# Patient Record
Sex: Male | Born: 1965 | Race: White | Hispanic: No | Smoking: Current every day smoker
Health system: Southern US, Community
[De-identification: ages and names within clinical notes are randomized; demographics above are authoritative.]

## PROBLEM LIST (undated history)

## (undated) DIAGNOSIS — I10 Essential (primary) hypertension: Secondary | ICD-10-CM

## (undated) DIAGNOSIS — R569 Unspecified convulsions: Secondary | ICD-10-CM

## (undated) DIAGNOSIS — E785 Hyperlipidemia, unspecified: Secondary | ICD-10-CM

## (undated) DIAGNOSIS — IMO0002 Reserved for concepts with insufficient information to code with codable children: Secondary | ICD-10-CM

## (undated) HISTORY — DX: Hyperlipidemia, unspecified: E78.5

## (undated) HISTORY — PX: FRACTURE SURGERY: SHX138

## (undated) HISTORY — DX: Essential (primary) hypertension: I10

## (undated) HISTORY — DX: Reserved for concepts with insufficient information to code with codable children: IMO0002

## (undated) HISTORY — DX: Unspecified convulsions: R56.9

---

## 2001-07-13 ENCOUNTER — Encounter: Payer: Self-pay | Admitting: Emergency Medicine

## 2001-07-13 ENCOUNTER — Inpatient Hospital Stay (HOSPITAL_COMMUNITY): Admission: AC | Admit: 2001-07-13 | Discharge: 2001-07-17 | Payer: Self-pay

## 2001-10-03 HISTORY — PX: OTHER SURGICAL HISTORY: SHX169

## 2002-02-15 ENCOUNTER — Encounter: Payer: Self-pay | Admitting: Orthopaedic Surgery

## 2002-02-19 ENCOUNTER — Observation Stay (HOSPITAL_COMMUNITY): Admission: RE | Admit: 2002-02-19 | Discharge: 2002-02-20 | Payer: Self-pay | Admitting: Orthopaedic Surgery

## 2002-02-19 ENCOUNTER — Encounter: Payer: Self-pay | Admitting: Orthopaedic Surgery

## 2007-01-01 ENCOUNTER — Ambulatory Visit: Payer: Self-pay | Admitting: Internal Medicine

## 2007-01-15 ENCOUNTER — Ambulatory Visit: Payer: Self-pay | Admitting: Internal Medicine

## 2007-02-27 ENCOUNTER — Ambulatory Visit: Payer: Self-pay | Admitting: Internal Medicine

## 2007-10-12 ENCOUNTER — Encounter: Payer: Self-pay | Admitting: Internal Medicine

## 2007-10-12 DIAGNOSIS — I10 Essential (primary) hypertension: Secondary | ICD-10-CM | POA: Insufficient documentation

## 2007-10-12 DIAGNOSIS — G40909 Epilepsy, unspecified, not intractable, without status epilepticus: Secondary | ICD-10-CM

## 2007-10-12 DIAGNOSIS — M5020 Other cervical disc displacement, unspecified cervical region: Secondary | ICD-10-CM

## 2007-10-12 DIAGNOSIS — E785 Hyperlipidemia, unspecified: Secondary | ICD-10-CM

## 2007-11-23 ENCOUNTER — Ambulatory Visit: Payer: Self-pay | Admitting: Internal Medicine

## 2007-11-23 DIAGNOSIS — M7710 Lateral epicondylitis, unspecified elbow: Secondary | ICD-10-CM | POA: Insufficient documentation

## 2007-12-01 ENCOUNTER — Encounter: Admission: RE | Admit: 2007-12-01 | Discharge: 2007-12-01 | Payer: Self-pay | Admitting: Internal Medicine

## 2007-12-04 ENCOUNTER — Telehealth: Payer: Self-pay | Admitting: Internal Medicine

## 2007-12-12 ENCOUNTER — Encounter: Payer: Self-pay | Admitting: Internal Medicine

## 2007-12-31 ENCOUNTER — Encounter: Payer: Self-pay | Admitting: Internal Medicine

## 2008-01-30 ENCOUNTER — Encounter: Admission: RE | Admit: 2008-01-30 | Discharge: 2008-02-22 | Payer: Self-pay | Admitting: Neurosurgery

## 2008-02-22 ENCOUNTER — Encounter: Payer: Self-pay | Admitting: Internal Medicine

## 2008-03-04 ENCOUNTER — Encounter: Payer: Self-pay | Admitting: Internal Medicine

## 2008-07-16 ENCOUNTER — Encounter: Payer: Self-pay | Admitting: Internal Medicine

## 2008-08-12 ENCOUNTER — Encounter: Admission: RE | Admit: 2008-08-12 | Discharge: 2008-08-12 | Payer: Self-pay | Admitting: Neurosurgery

## 2008-08-15 ENCOUNTER — Encounter: Payer: Self-pay | Admitting: Internal Medicine

## 2009-09-08 ENCOUNTER — Telehealth: Payer: Self-pay | Admitting: Internal Medicine

## 2009-12-23 ENCOUNTER — Telehealth (INDEPENDENT_AMBULATORY_CARE_PROVIDER_SITE_OTHER): Payer: Self-pay | Admitting: *Deleted

## 2010-01-13 ENCOUNTER — Telehealth: Payer: Self-pay | Admitting: Internal Medicine

## 2010-05-18 ENCOUNTER — Ambulatory Visit: Payer: Self-pay | Admitting: Internal Medicine

## 2010-05-18 DIAGNOSIS — F17200 Nicotine dependence, unspecified, uncomplicated: Secondary | ICD-10-CM

## 2010-05-18 DIAGNOSIS — M546 Pain in thoracic spine: Secondary | ICD-10-CM | POA: Insufficient documentation

## 2010-11-02 NOTE — Progress Notes (Signed)
Summary: rx request  Phone Note Call from Patient Call back at Home Phone [phone]   Summary of Call: Patient called stating that his brother passed (in yesterdays paper) and he is having trouble sleeping, anxiety issues, and would like prescription to help him. ?Xanax. Please advise. Initial call taken by: Lucious Groves,  December 23, 2009 10:20 AM  Follow-up for Phone Call        alprazolam 0.5 mg 1 by mouth q 6 as needed # 60, no refills Follow-up by: Jacques Navy MD,  December 23, 2009 12:58 PM    New/Updated Medications: ALPRAZOLAM 0.5 MG TABS (ALPRAZOLAM) 1 by mouth q6 hrs as needed Prescriptions: ALPRAZOLAM 0.5 MG TABS (ALPRAZOLAM) 1 by mouth q6 hrs as needed  #60 x 0   Entered by:   Ami Bullins CMA   Authorized by:   A [facility] Triage Nurse   Signed by:   Bill Salinas CMA on 12/23/2009   Method used:   Telephoned to ...       [facility]       [address]       [place], [place]  [address]       Ph: [phone]       Fax: [phone]   RxID:   [id number]

## 2010-11-02 NOTE — Assessment & Plan Note (Signed)
Summary: MIDDLE BACK PAIN--#---STC   Vital Signs:  Patient profile:   45 year old male Height:      72 inches Weight:      195 pounds BMI:     26.54 O2 Sat:      97 % on Room air Temp:     98.5 degrees F oral Pulse rate:   69 / minute BP sitting:   138 / 96  (left arm) Cuff size:   regular  Vitals Entered By: Charlynne Cousins CMA (May 18, 2010 9:46 AM)  O2 Flow:  Room air CC: pt here with c/o of mid back pain (for about 1 week) stabbing in nature. 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 Pt states he does lift alot of heavy furniture for his job/ ab.  History of Present Illness: Anthony Harrington is a 45 yo Caucasian male complaining of a 2wk history of mid back pain radiating down his flank area bilaterally. He describes the pain as a cramping stabbing pain, 10 out of 10 on the pain scale, so severe that it takes his breath away. The pain is worst in the morning and worsened with twisting movement. The pain is refractory to low dose NSAIDs. The patient has a history of degenerative disk disease which was operated on 10 years ago and a herniated cervical disk treated with a cortisone injection 1 year ago. He fears that his back pain may be similar to the same back pain his brother had before dying of a heart attack.  Anthony Harrington is also interested in quitting smoking. He has smoked half a pack a day for 22 years. He has never tried to quit.   Current Medications (verified): 1)  Atenolol 25 Mg  Tabs (Atenolol) .... Take 1 By Mouth Qd 2)  Dilt-Xr 180 Mg  Cp24 (Diltiazem Hcl) .... Take 1 By Mouth Qd 3)  Prednisone 10 Mg  Tabs (Prednisone) .... 4 Tabs Q D X 2, 3 Tabs Once Daily X 3, 2 Tabs Once Daily X3, 1 Ta B Once Daily X6 4)  Hydrocodone-Acetaminophen 5-500 Mg  Tabs (Hydrocodone-Acetaminophen)  .Marland Kitchen.. 1 Q 6 Hrs As Needed Neck/arm Pain. 5)  Alprazolam 0.5 Mg Tabs (Alprazolam) .[facility].. 1 By Mouth Q6 Hrs As Needed  Allergies (verified): No Known Drug Allergies  Past History:  Past Medical History: Last updated: 11/23/2007 Hyperlipidemia Hypertension Seizure disorder Degenerative disk disease-lumbar and cervical  Past Surgical History: Last updated: 10/12/2007 Fractured (L) hip (2000) Lumbar disc surgery (2001)  Family History: Father - DM, CAD, HTN, CVA Mother - HTN, died glioblastoma multiforme Brother - DM, Heart disease, died MI 2023-01-27  Social History: smokes half a pack a day since age 26  Review of Systems  The patient denies fever, weight gain, vision loss, decreased hearing, chest pain, syncope, dyspnea on exertion, prolonged cough, headaches, abdominal pain, melena, hematochezia, severe indigestion/heartburn, hematuria, muscle weakness, and difficulty walking.         positive - wt loss (30lbs in 7 months through exercise)  Physical Exam  General:  alert and normal appearance.   Head:  normocephalic, atraumatic, and no abnormalities observed.   Eyes:  pupils equal, pupils round, and pupils reactive to light.   Ears:  R ear normal, L ear normal, and  no external deformities.   Nose:  no external deformity and no external erythema.   Mouth:  no gingival abnormalities and pharynx pink and moist.   Neck:  supple, full ROM, and no masses.   Chest Wall:  no deformities, no tenderness, and no masses.   Lungs:  normal respiratory effort, no accessory muscle use, no crackles, and no wheezes.   Heart:  normal rate, regular rhythm, no murmur, no gallop, and no rub.   Abdomen:  soft, non-tender, no distention, no masses, no guarding, no hepatomegaly, and no splenomegaly.   Msk:  Patient could stand from the seated position w/o the use of his arms or assistance. He could bend from the waist towards the floor 90 degrees without pain. He could walk without assistance and without  experiencing pain. He could stand on his toes and heels and walk without assistance or experiencing pain. Patient could step up to the exam table and back down with his left leg without assistance or experiencing pain. Patient could step up to the exam table with his right leg without pain or assitance but experienced a sharp pain when he stepped back down. No pain exhibited on right or left leg extension. Patient had tenderness to palpation at the right of the thoracic spine. Exam was negative to CVA tenderness. Pain exhibited when patient reaches with his right hand to his left upper back.  Pulses:  R radial normal and L radial normal.   Extremities:  No edema. Neurologic:  alert & oriented X3, strength normal in all extremities, sensation to light touch, pinprick, and vibratory sensation in tact, gait normal, and DTRs symmetrical and normal for lower extremities. Triceps and biceps reflexes mildly diminished.  Skin:  turgor normal, color normal, no rashes, and no suspicious lesions.   Psych:  Oriented X3, normally interactive, good eye contact, not anxious appearing, and not depressed appearing.     Impression & Recommendations:  Problem # 1:  BACK PAIN, THORACIC REGION (ICD-724.1) Assessment  -  Patient's history and physical suggest back pain of muskuloskeletal origin. The pain is in no way related to angina pectoris.  Plan - Patient was reassured that his pain was not in any way related to his heart. advised to:            -stretch and flex 10 minutes every day and before lifting furniture at work            -take 1-2 TAB of Naproxen sodium 220mg  every 12 hrs            -Apply heat patches            -take Cyclobenzapirine 10mg  three times a day as needed             -search [email or web address] for upper back pain exercises and stretches             -warm up before lifting furniture and lift smartly using legs with a squared base, objects out in front  Problem # 2:  [place]  (ICD-305.1)  Assessment - Patient has expressed the desire to quit and was interested in having the initial conversation.  Plan - The patient was evaluated for his readiness in quitting. The pathophysiology, history, and impact of nicotine addiction was explained to him. The patient was strongly encouraged to quit and it was made clear this is the very best move he can make towards improving his health. He was given a clear cut strategy on the  steps needed to be made prior to setting a quit date as well as follow up issues post quit date such as the help of a medication like Chantix. Patient was advised to return when he is ready to set a quit date after taking time to think about the conversation.   Orders: Tobacco use cessation intensive >10 minutes (80998)  Complete Medication List: 1)  Atenolol 25 Mg Tabs (Atenolol) .... Take 1 by mouth qd 2)  Dilt-xr 180 Mg Cp24 (Diltiazem hcl) .... Take 1 by mouth qd 3)  Prednisone 10 Mg Tabs (Prednisone) .... 4 tabs q d x 2, 3 tabs once daily x 3, 2 tabs once daily x3, 1 ta b once daily x6 4)  Hydrocodone-acetaminophen 5-500 Mg Tabs (Hydrocodone-acetaminophen) .Marland Kitchen.. 1 q 6 hrs as needed neck/arm pain. 5)  Alprazolam 0.5 Mg Tabs (Alprazolam) .[facility].. 1 by mouth q6 hrs as needed

## 2010-11-02 NOTE — Progress Notes (Signed)
  Phone Note Refill Request Message from:  Fax from Pharmacy on January 13, 2010 9:06 AM  Refills Requested: Medication #1:  ALPRAZOLAM 0.5 MG TABS 1 by mouth q6 hrs as needed.   Last Refilled: 12/23/2009 pt's last office visit was 11/23/2007 please advise refill.  Initial call taken by: Ami Bullins CMA,  January 13, 2010 9:07 AM  Follow-up for Phone Call        ok for #30. needs OV Follow-up by: Jacques Navy MD,  January 13, 2010 2:36 PM    Prescriptions: ALPRAZOLAM 0.5 MG TABS (ALPRAZOLAM) 1 by mouth q6 hrs as needed  #60 x 0   Entered by:   Rock Nephew CMA   Authorized by:   Jacques Navy MD   Signed by:   Rock Nephew CMA on 01/13/2010   Method used:   Telephoned to ...       [facility]       [address]       [place], [place]  [address]       Ph: [phone]       Fax: [phone]   RxID:   [id number]

## 2010-12-29 ENCOUNTER — Other Ambulatory Visit: Payer: Self-pay | Admitting: Internal Medicine
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
## 2011-02-18 NOTE — H&P (Signed)
[facility]  Patient:    Anthony Harrington, Anthony Harrington Visit Number: [id number] MRN: [id number]          Service Type: DSU Location: DAY Attending Physician:  Starling Manns Dictated by:   Jannette Fogo. Jenean Lindau, P.A.-C. Admit Date:  02/19/2002                           History and Physical  CHIEF COMPLAINT:  Back pain.  HISTORY OF PRESENT ILLNESS:  The patient is a 45 year old male who has an approximate four month history of low back pain and left lower extremity radicular type symptoms.  The patient complains of increasing pain in frequency and duration.  He is status post conservative treatment.  However, the patient persists to have pain and disability.  He has been seen and evaluated by Starling Manns, [name], at [facility] and found to have a herniated nucleus pulposus L5-S1 as well as lateral recess stenosis. Due to these findings and his worsening condition, the decision was made to proceed with operative intervention.  ALLERGIES:  No known drug allergies.  MEDICATIONS: 1. Hydrocodone one to two tablets every four to six hours. 2. Lisinopril 20/12.5 one q.d. 3. Ultracet one to two tablets every six hours. 4. Celebrex 200 mg one q.d. 5. Tizanidine 4 mg one to a half three times a day.  PAST MEDICAL HISTORY:  Hypertension, seizures approximately 15 years ago with no subsequent seizures since that time, history of a pelvic fracture.  PAST MEDICAL HISTORY:  Noncontributory.  SOCIAL HISTORY:  He is single.  He smokes about one pack per day and has about five beers nightly.  FAMILY HISTORY:  His father had coronary artery disease, diabetes mellitus, and a CVA.  Mother had hypertension and glioblastoma multiforme.  REVIEW OF SYSTEMS:  In general, he denies any recent weight loss, night sweats, fever or chills, decrease in weight or loss of appetite.  HEENT: Denies any history of chronic headaches, ringing of the ears, runny nose, or sore throat.   Chest:  He has occasional cough he contributes to seasonal allergies, nonproductive, no hemoptysis.  Heart:  He denies any history of chest pain, irregular heart beats, or syncopal episodes.  GI/GU:  He denies any history of chronic diarrhea or constipation, melena, bright red stools per rectum.  No dysuria, frequency, urgency or nocturia.  Extremities:  Please see HPI.  Neurologic:  He, again, did have previous history of a seizure disorder approximately 15 years ago with no recent exacerbation since that time.  PHYSICAL EXAMINATION:  GENERAL APPEARANCE:  This is a very pleasant 45 year old white male in no acute distress.  VITAL SIGNS:  Blood pressure 140/90, respiratory rate 12, pulse 90.  HEENT:  Head is atraumatic and normocephalic.  NECK:  Supple without masses.  No carotid bruits auscultated.  CHEST:  Clear to auscultation bilaterally.  CARDIOVASCULAR:  Regular rate and rhythm, S1 and S2.  ABDOMEN:  Soft and nontender, bowel sounds are positive x4 quadrants.  EXTREMITIES:  The patient has no obvious abnormalities of the spine. He does have positive SLR.  He has decreased sensation noted in S1 dermatome.  He has decreased S1 reflex on the left, some give away weakness in the gastroc.  LAB AND X-RAY:  MRI shows an L5-S1 disc herniation.  IMPRESSION:  Left L5-S1 herniated nucleus pulposus, lateral recess stenosis as well as hypertension, history of pelvic fracture and history of seizure disorder 15 years  ago.  PLAN:  The patient will be admitted to [facility] to undergo a left L5-S1 microdiskectomy per Dr. Rennis Harding on Feb 19, 2002, at 1:30 p.m. Dictated by:   Jannette Fogo. Jenean Lindau, P.A.-C. Attending Physician:  Starling Manns. DD:  02/18/02 TD:  02/19/02 Job: [id number]

## 2011-02-18 NOTE — Op Note (Signed)
[facility]  Patient:    Anthony Harrington, Anthony Harrington Visit Number: [id number] MRN: [id number]          Service Type: SUR Location: 4W [id number] Attending Physician:  Patricia Nettle Dictated by:   Sharolyn Douglas, [name] Proc. Date: 02/19/02 Admit Date:  02/19/2002 Discharge Date: 02/20/2002                             Operative Report  DIAGNOSIS:  Left L5-S1 herniated nucleus pulposus with lateral recess stenosis and left S1 radiculopathy.  PROCEDURE:  Left L5-S1 microdiskectomy with lateral recess decompression.  SURGEON:  Sharolyn Douglas, [name]  ASSISTANTJill Side P. Mahar, P.A.  ANESTHESIA:  General endotracheal.  COMPLICATIONS:  None.  INDICATIONS AND FINDINGS:  The patient is a 45 year old male who I have been treating for back and left lower extremity radicular pain in an S1 pattern. Examination is consistent with an S1 radiculopathy. Plain x-rays show disk space narrowing at L5-S1. MRI scan demonstrated a focal disk herniation at L5-S1 which compresses the left S1 nerve and narrows the lateral recess. After failing an appropriate course of conservative treatment, the patient elected to undergo microsurgical diskectomy in hopes of improving his symptomatology. At surgery, the lateral recess was found to be severely narrowed secondary to bony overgrowth of the superior facet as well as a subligamentous disk bulge. The disk material was found to be degenerative and a single large piece was removed.  DESCRIPTION OF PROCEDURE:  The patient was properly identified in the holding area and taken to the operating room. He underwent general endotracheal anesthesia without difficulty. He was given prophylactic IV antibiotics. He was turned prone onto the [employer] four-poster positioning frame. All bony prominences were padded. The face was protected at all times. A spinal needle was placed at the L5-S1 segment. X-ray was taken to confirm the level. The back was prepped and  draped in usual sterile fashion. We made a 2-cm incision centered over the L5-S1 level. We carried this down into the deep fascia. The deep fascia was incised and paraspinal muscles were subperiosteally elevated out to the L5-S1 joint on the left side. Care was taken to protect the joint capsule itself. We then placed a marker under the L5 lamina, took an x-ray and confirmed the location. We then draped the microscope and brought it into the surgical field. Using a high speed burr, we removed the inferior portion of the L2 lamina as well as the medial one third of the L5-S1 facet. We then used Kerrison punches and curettes to complete the laminotomy. The ligamentum flavum was released superiorly, laterally and inferiorly creating a flap. We were then able to enter the epidural space, identify the S1 nerve root, retract it medially. The disk was identified, found to be bulging and compressing the S1 nerve root. We removed the overhanging lip of the superior S1 facet further exposing the disk. We were able to easily punch through the posterior longitudinal ligament with a Penfield and immediately degenerative disk material began to express out. We then used a pituitary to remove a large fragment of disk. At this point, we used upbiting as well as straight pituitaries to remove any loose fragments. We used disk space irrigation, floated out a small piece of disk and this was removed. All bleeding was controlled with bipolar electrocautery and Gelfoam. We were able to palpate the lateral recess and confirm that the S1 nerve root was  free all the way out the foramen. We then placed 50 mcg of preservative-free Fentanyl into the epidural space. We were able to flap the ligamentum flavum back over the epidural space. The deep layer was closed with a #1 running Vicryl followed by 2-0 Vicryl on the subcutaneous layer and then a running 4-0 Vicryl followed by benzoin and Steri-Strips. A sterile  dressing was applied. The patient was turned supine, extubated without difficulty and able to move his lower extremities when he was transferred to the recovery room in stable condition. Dictated by:   Sharolyn Douglas, [name] Attending Physician:  Patricia Nettle. DD:  02/19/02 TD:  02/21/02 Job: [id number]
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
## 2011-02-28 ENCOUNTER — Other Ambulatory Visit: Payer: Self-pay | Admitting: Internal Medicine

## 2011-08-23 ENCOUNTER — Other Ambulatory Visit: Payer: Self-pay | Admitting: Internal Medicine

## 2011-09-21 ENCOUNTER — Other Ambulatory Visit: Payer: Self-pay | Admitting: Internal Medicine

## 2011-12-22 ENCOUNTER — Other Ambulatory Visit: Payer: Self-pay | Admitting: Internal Medicine

## 2011-12-26 ENCOUNTER — Other Ambulatory Visit: Payer: Self-pay

## 2011-12-26 MED ORDER — ATENOLOL 25 MG PO TABS: 25.0000 mg | ORAL_TABLET | Freq: Every day | ORAL | Status: DC

## 2011-12-26 NOTE — Telephone Encounter (Signed)
Pt called requesting refill of medication. He was advised that F/U if required, 30 day supply authorized and pt transferred to schedule appt.

## 2012-02-09 ENCOUNTER — Ambulatory Visit (INDEPENDENT_AMBULATORY_CARE_PROVIDER_SITE_OTHER)
Admission: RE | Admit: 2012-02-09 | Discharge: 2012-02-09 | Disposition: A | Discharge status: Home / Self Care | Payer: 59 | Source: Ambulatory Visit | Attending: Internal Medicine | Admitting: Internal Medicine

## 2012-02-09 ENCOUNTER — Encounter: Payer: Self-pay | Admitting: Internal Medicine

## 2012-02-09 ENCOUNTER — Ambulatory Visit (INDEPENDENT_AMBULATORY_CARE_PROVIDER_SITE_OTHER): Payer: 59 | Admitting: Internal Medicine

## 2012-02-09 ENCOUNTER — Other Ambulatory Visit (INDEPENDENT_AMBULATORY_CARE_PROVIDER_SITE_OTHER): Payer: 59

## 2012-02-09 VITALS — BP 180/110 | HR 77 | Temp 98.4°F | Resp 16 | Wt 228.0 lb

## 2012-02-09 DIAGNOSIS — Z0000 Encounter for general adult medical examination without abnormal findings: Secondary | ICD-10-CM

## 2012-02-09 DIAGNOSIS — I10 Essential (primary) hypertension: Secondary | ICD-10-CM

## 2012-02-09 DIAGNOSIS — E785 Hyperlipidemia, unspecified: Secondary | ICD-10-CM

## 2012-02-09 DIAGNOSIS — F17200 Nicotine dependence, unspecified, uncomplicated: Secondary | ICD-10-CM

## 2012-02-09 DIAGNOSIS — M5020 Other cervical disc displacement, unspecified cervical region: Secondary | ICD-10-CM

## 2012-02-09 LAB — COMPREHENSIVE METABOLIC PANEL
ALT: 31 U/L (ref 0–53)
AST: 27 U/L (ref 0–37)
BUN: 16 mg/dL (ref 6–23)
Creatinine, Ser: 0.7 mg/dL (ref 0.4–1.5)
GFR: 123.69 mL/min (ref >60.00)
Total Bilirubin: 0.8 mg/dL (ref 0.3–1.2)

## 2012-02-09 LAB — CBC WITH DIFFERENTIAL/PLATELET
Eosinophils Relative: 2.9 % (ref 0.0–5.0)
HCT: 42 % (ref 39.0–52.0)
Lymphocytes Relative: 29.2 % (ref 12.0–46.0)
Monocytes Relative: 10.2 % (ref 3.0–12.0)
Neutrophils Relative %: 56.9 % (ref 43.0–77.0)
Platelets: 193 10*3/uL (ref 150.0–400.0)
WBC: 6.2 10*3/uL (ref 4.5–10.5)

## 2012-02-09 LAB — LIPID PANEL
Cholesterol: 239 mg/dL — ABNORMAL HIGH (ref 0–200)
Total CHOL/HDL Ratio: 3
Triglycerides: 86 mg/dL (ref 0.0–149.0)
VLDL: 17.2 mg/dL (ref 0.0–40.0)

## 2012-02-09 NOTE — Assessment & Plan Note (Addendum)
Advised to stop smoking.  Plan - CXR

## 2012-02-09 NOTE — Assessment & Plan Note (Signed)
Persistent pain with paresthesia Right UE. Positional pain left UE. Raises concern for progressive disk disease  Plan - follow-up MRI c-spine  For DDD with impingement he may be a candidate for [facility]

## 2012-02-09 NOTE — Assessment & Plan Note (Signed)
No recent lab  Plan - lipid profile.

## 2012-02-09 NOTE — Assessment & Plan Note (Signed)
BP Readings from Last 3 Encounters:  02/09/12 180/110  05/18/10 138/96  11/23/07 154/99   BP is out of control.  Plan - d/c diltiazem           Continue tenormin 25 mg daily           Add Benicar/Hct 20/12.5 (14 days of samples)           Bmet

## 2012-02-09 NOTE — Progress Notes (Signed)
  Subjective:    Patient ID: Anthony Harrington, male    DOB: 1966-07-03, 46 y.o.   MRN: [id number]  HPI Anthony Harrington was last seen in August '11 for mid-back pain - muscle strain. He presents today for routine followup. His main concern is pain right shoulder with radiation down his arm to his hand. He has a paresthesia 4th finger right. He will have paresthesia left hand that is positional at night. He has had cervical disk disease and had ESI by Dr. Phoebe Perch in '09  that did help. He does have some decreased grip strength due to shooting pain that goes up the arm.  He is treated for HTN - on diltiazem 180 and tenormin 25. BP today is 180/110. He does not check his BP at other times.   He reports that he has otherwise been doing ok.   Past Medical History  Diagnosis Date  . Hyperlipidemia   . Hypertension   . Seizures   . DDD (degenerative disc disease)     lumbar and cervical   Past Surgical History  Procedure Date  . Fracture surgery     left hip   Family History  Problem Relation Age of Onset  . Hypertension Mother   . Diabetes Father   . Heart disease Father   . Hyperlipidemia Father   . Stroke Father   . Diabetes Brother   . Heart disease Brother    History   Social History  . Marital Status: Single    Spouse Name: N/A    Number of Children: N/A  . Years of Education: N/A   Occupational History  . Not on file.   Social History Main Topics  . Smoking status: Current Everyday Smoker  . Smokeless tobacco: Not on file  . Alcohol Use: Not on file  . Drug Use: Not on file  . Sexually Active: Not on file   Other Topics Concern  . Not on file   Social History Narrative  . No narrative on file       Review of Systems     Objective:   Physical Exam Filed Vitals:   02/09/12 1505  BP: 180/110  Pulse: 77  Temp: 98.4 F (36.9 C)  Resp: 16   Wt Readings from Last 3 Encounters:  02/09/12 228 lb (103.42 kg)  05/18/10 195 lb (88.451 kg)  11/23/07 210 lb (95.255  kg)   Gen'l- WNWD white man in no distress HEENT- PERRLA, EOMI, fundi- sharp disc margins, no hemorrhages Neck - full ROM Cor- 2+ radial, RRR PUlm - good breath sounds MSK - normal ROM right shoulder, elbow and wrist; normal range of motion left UE Neuro - A&O x 3, CN II-XII normal. DTR's 2+ symmetrical, MS 5/5 and equal. Sensation right hand normal to soft touch, pin prick. Mildly diminished two point discrimination tips of 3rt and 4th finger right.  Lab Results  Component Value Date   WBC 6.2 02/09/2012   HGB 14.3 02/09/2012   HCT 42.0 02/09/2012   PLT 193.0 02/09/2012   GLUCOSE 98 02/09/2012   CHOL 239* 02/09/2012   TRIG 86.0 02/09/2012   HDL 87.30 02/09/2012   LDLDIRECT 144.2 02/09/2012   ALT 31 02/09/2012   AST 27 02/09/2012   NA 140 02/09/2012   K 4.0 02/09/2012   CL 106 02/09/2012   CREATININE 0.7 02/09/2012   BUN 16 02/09/2012   CO2 25 02/09/2012        Assessment & Plan:

## 2012-02-09 NOTE — Patient Instructions (Signed)
1. Arm pain - shoulder seems to move well. The concern is for progressive disk disease in the neck. Plan For pain Aleve two tabs twice a day; tylenol 500 mg two tablets three times a day on schedule  MRI cervical spine  2. Tobacco - STOP SMOKING  pLAN - Chest x-ray today  3. Blood pressure - out of control.  Plan - stop diltiazem  Continue tenormin  Start benicar/hct 20/12.5 - take once a day   Lab today  You need to return for BP check in 7-10 days.  4. Choelsterol - elevated in the past Plan Lab today.  You need to STOP Smoking.  Smoking Cessation This document explains the best ways for you to quit smoking and new treatments to help. It lists new medicines that can double or triple your chances of quitting and quitting for good. It also considers ways to avoid relapses and concerns you may have about quitting, including weight gain. NICOTINE: A POWERFUL ADDICTION If you have tried to quit smoking, you know how hard it can be. It is hard because nicotine is a very addictive drug. For some people, it can be as addictive as heroin or cocaine. Usually, people make 2 or 3 tries, or more, before finally being able to quit. Each time you try to quit, you can learn about what helps and what hurts. Quitting takes hard work and a lot of effort, but you can quit smoking. QUITTING SMOKING IS ONE OF THE MOST IMPORTANT THINGS YOU WILL EVER DO.  You will live longer, feel better, and live better.   The impact on your body of quitting smoking is felt almost immediately:   Within 20 minutes, blood pressure decreases. Pulse returns to its normal level.   After 8 hours, carbon monoxide levels in the blood return to normal. Oxygen level increases.   After 24 hours, chance of heart attack starts to decrease. Breath, hair, and body stop smelling like smoke.   After 48 hours, damaged nerve endings begin to recover. Sense of taste and smell improve.   After 72 hours, the body is virtually free of  nicotine. Bronchial tubes relax and breathing becomes easier.   After 2 to 12 weeks, lungs can hold more air. Exercise becomes easier and circulation improves.   Quitting will reduce your risk of having a heart attack, stroke, cancer, or lung disease:   After 1 year, the risk of coronary heart disease is cut in half.   After 5 years, the risk of stroke falls to the same as a nonsmoker.   After 10 years, the risk of lung cancer is cut in half and the risk of other cancers decreases significantly.   After 15 years, the risk of coronary heart disease drops, usually to the level of a nonsmoker.   If you are pregnant, quitting smoking will improve your chances of having a healthy baby.   The people you live with, especially your children, will be healthier.   You will have extra money to spend on things other than cigarettes.  FIVE KEYS TO QUITTING Studies have shown that these 5 steps will help you quit smoking and quit for good. You have the best chances of quitting if you use them together: 1. Get ready.  2. Get support and encouragement.  3. Learn new skills and behaviors.  4. Get medicine to reduce your nicotine addiction and use it correctly.  5. Be prepared for relapse or difficult situations. Be determined to continue  trying to quit, even if you do not succeed at first.  1. GET READY  Set a quit date.   Change your environment.   Get rid of ALL cigarettes, ashtrays, matches, and lighters in your home, car, and place of work.   Do not let people smoke in your home.   Review your past attempts to quit. Think about what worked and what did not.   Once you quit, do not smoke. NOT EVEN A PUFF!  2. GET SUPPORT AND ENCOURAGEMENT Studies have shown that you have a better chance of being successful if you have help. You can get support in many ways.  Tell your family, friends, and coworkers that you are going to quit and need their support. Ask them not to smoke around you.    Talk to your caregivers (doctor, dentist, nurse, pharmacist, psychologist, and/or smoking counselor).   Get individual, group, or telephone counseling and support. The more counseling you have, the better your chances are of quitting. Programs are available at [employer] and health centers. Call your local health department for information about programs in your area.   Spiritual beliefs and practices may help some smokers quit.   Quit meters are Photographer that keep track of quit statistics, such as amount of "quit-time," cigarettes not smoked, and money saved.   Many smokers find one or more of the many self-help books available useful in helping them quit and stay off tobacco.  3. LEARN NEW SKILLS AND BEHAVIORS  Try to distract yourself from urges to smoke. Talk to someone, go for a walk, or occupy your time with a task.   When you first try to quit, change your routine. Take a different route to work. Drink tea instead of coffee. Eat breakfast in a different place.   Do something to reduce your stress. Take a hot bath, exercise, or read a book.   Plan something enjoyable to do every day. Reward yourself for not smoking.   Explore interactive web-based programs that specialize in helping you quit.  4. GET MEDICINE AND USE IT CORRECTLY Medicines can help you stop smoking and decrease the urge to smoke. Combining medicine with the above behavioral methods and support can quadruple your chances of successfully quitting smoking. The U.[place]. Food and Drug Administration (FDA) has approved 7 medicines to help you quit smoking. These medicines fall into 3 categories.  Nicotine replacement therapy (delivers nicotine to your body without the negative effects and risks of smoking):   Nicotine gum: Available over-the-counter.   Nicotine lozenges: Available over-the-counter.   Nicotine inhaler: Available by prescription.   Nicotine nasal spray:  Available by prescription.   Nicotine skin patches (transdermal): Available by prescription and over-the-counter.   Antidepressant medicine (helps people abstain from smoking, but how this works is unknown):   Bupropion sustained-release (SR) tablets: Available by prescription.   Nicotinic receptor partial agonist (simulates the effect of nicotine in your brain):   Varenicline tartrate tablets: Available by prescription.   Ask your caregiver for advice about which medicines to use and how to use them. Carefully read the information on the package.   Everyone who is trying to quit may benefit from using a medicine. If you are pregnant or trying to become pregnant, nursing an infant, you are under age 77, or you smoke fewer than 10 cigarettes per day, talk to your caregiver before taking any nicotine replacement medicines.   You should stop using a nicotine replacement  product and call your caregiver if you experience nausea, dizziness, weakness, vomiting, fast or irregular heartbeat, mouth problems with the lozenge or gum, or redness or swelling of the skin around the patch that does not go away.   Do not use any other product containing nicotine while using a nicotine replacement product.   Talk to your caregiver before using these products if you have diabetes, heart disease, asthma, stomach ulcers, you had a recent heart attack, you have high blood pressure that is not controlled with medicine, a history of irregular heartbeat, or you have been prescribed medicine to help you quit smoking.  5. BE PREPARED FOR RELAPSE OR DIFFICULT SITUATIONS  Most relapses occur within the first 3 months after quitting. Do not be discouraged if you start smoking again. Remember, most people try several times before they finally quit.   You may have symptoms of withdrawal because your body is used to nicotine. You may crave cigarettes, be irritable, feel very hungry, cough often, get headaches, or have  difficulty concentrating.   The withdrawal symptoms are only temporary. They are strongest when you first quit, but they will go away within 10 to 14 days.  Here are some difficult situations to watch for:  Alcohol. Avoid drinking alcohol. Drinking lowers your chances of successfully quitting.   Caffeine. Try to reduce the amount of caffeine you consume. It also lowers your chances of successfully quitting.   Other smokers. Being around smoking can make you want to smoke. Avoid smokers.   Weight gain. Many smokers will gain weight when they quit, usually less than 10 pounds. Eat a healthy diet and stay active. Do not let weight gain distract you from your main goal, quitting smoking. Some medicines that help you quit smoking may also help delay weight gain. You can always lose the weight gained after you quit.   Bad mood or depression. There are a lot of ways to improve your mood other than smoking.  If you are having problems with any of these situations, talk to your caregiver. SPECIAL SITUATIONS AND CONDITIONS Studies suggest that everyone can quit smoking. Your situation or condition can give you a special reason to quit.  Pregnant women/new mothers: By quitting, you protect your baby's health and your own.   Hospitalized patients: By quitting, you reduce health problems and help healing.   Heart attack patients: By quitting, you reduce your risk of a second heart attack.   Lung, head, and neck cancer patients: By quitting, you reduce your chance of a second cancer.   Parents of children and adolescents: By quitting, you protect your children from illnesses caused by secondhand smoke.  QUESTIONS TO THINK ABOUT Think about the following questions before you try to stop smoking. You may want to talk about your answers with your caregiver.  Why do you want to quit?   If you tried to quit in the past, what helped and what did not?   What will be the most difficult situations for you  after you quit? How will you plan to handle them?   Who can help you through the tough times? Your family? Friends? Caregiver?   What pleasures do you get from smoking? What ways can you still get pleasure if you quit?  Here are some questions to ask your caregiver:  How can you help me to be successful at quitting?   What medicine do you think would be best for me and how should I take it?  What should I do if I need more help?   What is smoking withdrawal like? How can I get information on withdrawal?  Quitting takes hard work and a lot of effort, but you can quit smoking. FOR MORE INFORMATION   Smokefree.gov (http://www.davis-sullivan.com/) provides free, accurate, evidence-based information and professional assistance to help support the immediate and long-term needs of people trying to quit smoking. Document Released: 09/13/2001 Document Revised: 09/08/2011 Document Reviewed: 07/06/2009 [facility] Patient Information 2012 [place], [place].   Smoking Cessation, Tips for Success YOU CAN QUIT SMOKING If you are ready to quit smoking, congratulations! You have chosen to help yourself be healthier. Cigarettes bring nicotine, tar, carbon monoxide, and other irritants into your body. Your lungs, heart, and blood vessels will be able to work better without these poisons. There are many different ways to quit smoking. Nicotine gum, nicotine patches, a nicotine inhaler, or nicotine nasal spray can help with physical craving. Hypnosis, support groups, and medicines help break the habit of smoking. Here are some tips to help you quit for good.  Throw away all cigarettes.   Clean and remove all ashtrays from your home, work, and car.   On a card, write down your reasons for quitting. Carry the card with you and read it when you get the urge to smoke.   Cleanse your body of nicotine. Drink enough water and fluids to keep your urine clear or pale yellow. Do this after quitting to flush the nicotine from  your body.   Learn to predict your moods. Do not let a bad situation be your excuse to have a cigarette. Some situations in your life might tempt you into wanting a cigarette.   Never have "just one" cigarette. It leads to wanting another and another. Remind yourself of your decision to quit.   Change habits associated with smoking. If you smoked while driving or when feeling stressed, try other activities to replace smoking. Stand up when drinking your coffee. Brush your teeth after eating. Sit in a different chair when you read the paper. Avoid alcohol while trying to quit, and try to drink fewer caffeinated beverages. Alcohol and caffeine may urge you to smoke.   Avoid foods and drinks that can trigger a desire to smoke, such as sugary or spicy foods and alcohol.   Ask people who smoke not to smoke around you.   Have something planned to do right after eating or having a cup of coffee. Take a walk or exercise to perk you up. This will help to keep you from overeating.   Try a relaxation exercise to calm you down and decrease your stress. Remember, you may be tense and nervous for the first 2 weeks after you quit, but this will pass.   Find new activities to keep your hands busy. Play with a pen, coin, or rubber band. Doodle or draw things on paper.   Brush your teeth right after eating. This will help cut down on the craving for the taste of tobacco after meals. You can try mouthwash, too.   Use oral substitutes, such as lemon drops, carrots, a cinnamon stick, or chewing gum, in place of cigarettes. Keep them handy so they are available when you have the urge to smoke.   When you have the urge to smoke, try deep breathing.   Designate your home as a nonsmoking area.   If you are a heavy smoker, ask your caregiver about a prescription for nicotine chewing gum. It can ease  your withdrawal from nicotine.   Reward yourself. Set aside the cigarette money you save and buy yourself something  nice.   Look for support from others. Join a support group or smoking cessation program. Ask someone at home or at work to help you with your plan to quit smoking.   Always ask yourself, "Do I need this cigarette or is this just a reflex?" Tell yourself, "Today, I choose not to smoke," or "I do not want to smoke." You are reminding yourself of your decision to quit, even if you do smoke a cigarette.  HOW WILL I FEEL WHEN I QUIT SMOKING?  The benefits of not smoking start within days of quitting.   You may have symptoms of withdrawal because your body is used to nicotine (the addictive substance in cigarettes). You may crave cigarettes, be irritable, feel very hungry, cough often, get headaches, or have difficulty concentrating.   The withdrawal symptoms are only temporary. They are strongest when you first quit but will go away within 10 to 14 days.   When withdrawal symptoms occur, stay in control. Think about your reasons for quitting. Remind yourself that these are signs that your body is healing and getting used to being without cigarettes.   Remember that withdrawal symptoms are easier to treat than the major diseases that smoking can cause.   Even after the withdrawal is over, expect periodic urges to smoke. However, these cravings are generally short-lived and will go away whether you smoke or not. Do not smoke!   If you relapse and smoke again, do not lose hope. Most smokers quit 3 times before they are successful.   If you relapse, do not give up! Plan ahead and think about what you will do the next time you get the urge to smoke.  LIFE AS A NONSMOKER: MAKE IT FOR A MONTH, MAKE IT FOR LIFE Day 1: Hang this page where you will see it every day. Day 2: Get rid of all ashtrays, matches, and lighters. Day 3: Drink water. Breathe deeply between sips. Day 4: Avoid places with smoke-filled air, such as bars, clubs, or the smoking section of restaurants. Day 5: Keep track of how much  money you save by not smoking. Day 6: Avoid boredom. Keep a good book with you or go to the movies. Day 7: Reward yourself! One week without smoking! Day 8: Make a dental appointment to get your teeth cleaned. Day 9: Decide how you will turn down a cigarette before it is offered to you. Day 10: Review your reasons for quitting. Day 11: Distract yourself. Stay active to keep your mind off smoking and to relieve tension. Take a walk, exercise, read a book, do a crossword puzzle, or try a new hobby. Day 12: Exercise. Get off the bus before your stop or use stairs instead of escalators. Day 13: Call on friends for support and encouragement. Day 14: Reward yourself! Two weeks without smoking! Day 15: Practice deep breathing exercises. Day 16: Bet a friend that you can stay a nonsmoker. Day 17: Ask to sit in nonsmoking sections of restaurants. Day 18: Hang up "No Smoking" signs. Day 19: Think of yourself as a nonsmoker. Day 20: Each morning, tell yourself you will not smoke. Day 21: Reward yourself! Three weeks without smoking! Day 22: Think of smoking in negative ways. Remember how it stains your teeth, gives you bad breath, and leaves you short of breath. Day 23: Eat a nutritious breakfast. Day 24:Do not  relive your days as a smoker. Day 25: Hold a pencil in your hand when talking on the telephone. Day 26: Tell all your friends you do not smoke. Day 27: Think about how much better food tastes. Day 28: Remember, one cigarette is one too many. Day 29: Take up a hobby that will keep your hands busy. Day 30: Congratulations! One month without smoking! Give yourself a big reward. Your caregiver can direct you to community resources or hospitals for support, which may include:  Group support.   Education.   Hypnosis.   Subliminal therapy.  Document Released: 06/17/2004 Document Revised: 09/08/2011 Document Reviewed: 07/06/2009 [facility] Patient Information 2012 [place], [place].

## 2012-02-12 DIAGNOSIS — Z0000 Encounter for general adult medical examination without abnormal findings: Secondary | ICD-10-CM | POA: Insufficient documentation

## 2012-02-12 NOTE — Assessment & Plan Note (Addendum)
Interval medical history significant for hand pain possibly related to DDD c-spine. He is otherwise doing OK. Physical exam as noted. He is due for tetanus immunization.  IN summary - a nice man with arm and hand pain. He is encouraged to stop smoking. He will be notified of x-ray and MRI results.

## 2012-02-14 ENCOUNTER — Telehealth: Payer: Self-pay | Admitting: *Deleted

## 2012-02-14 NOTE — Telephone Encounter (Signed)
Patient notified of lab results. Patient states no one has notified him yet of appointment for MRI to be done. ??  States that meds aleive and tylenol are not helping and are making him nauseated patient states his blood pressure has come down  On Sunday was 149/82. Please advise on pain medication and schedule of MRI. sue

## 2012-02-14 NOTE — Telephone Encounter (Signed)
No MRI report in EPIC for me to look at.  Labs were all normal except for elevated LDL cholesterol at 144.

## 2012-02-14 NOTE — Telephone Encounter (Signed)
rveiwed last office note: MRI was ordered May 9th. Please check with PCCs as to status of the order. Tnks

## 2012-02-14 NOTE — Telephone Encounter (Signed)
Patient called  Request lab work results and MRI results done 02/09/2012. Called from mobile phone.

## 2012-02-15 MED ORDER — PREDNISONE 10 MG PO TABS: 10.0000 mg | ORAL_TABLET | Freq: Every day | ORAL | Status: DC

## 2012-02-15 MED ORDER — HYDROCODONE-ACETAMINOPHEN 10-325 MG PO TABS: 1.0000 | ORAL_TABLET | Freq: Three times a day (TID) | ORAL | Status: DC | PRN

## 2012-02-15 NOTE — Telephone Encounter (Signed)
Addended by: Illene Regulus E on: 02/15/2012 01:00 PM   Modules accepted: Orders

## 2012-02-15 NOTE — Telephone Encounter (Signed)
Patient MRI schedule May 21st.. Patent need advice on pain management. Other than aleive   And tylenol.

## 2012-02-15 NOTE — Telephone Encounter (Signed)
Patient notified of pain medication called into pharmacy . Anthony Harrington

## 2012-02-15 NOTE — Telephone Encounter (Signed)
For pain control: 1. Prednisone burst and taper - sent in 2. hydorcodone 5/325 every 6 hours as needed  Sent in

## 2012-02-20 ENCOUNTER — Ambulatory Visit (INDEPENDENT_AMBULATORY_CARE_PROVIDER_SITE_OTHER): Payer: 59 | Admitting: Internal Medicine

## 2012-02-20 ENCOUNTER — Encounter: Payer: Self-pay | Admitting: Internal Medicine

## 2012-02-20 VITALS — BP 160/90 | HR 76 | Temp 98.6°F | Ht 72.0 in | Wt 224.0 lb

## 2012-02-20 DIAGNOSIS — I10 Essential (primary) hypertension: Secondary | ICD-10-CM

## 2012-02-20 MED ORDER — OLMESARTAN MEDOXOMIL-HCTZ 40-25 MG PO TABS: 1.0000 | ORAL_TABLET | Freq: Every day | ORAL | Status: DC

## 2012-02-20 NOTE — Progress Notes (Signed)
  Subjective:    Patient ID: Anthony Harrington, male    DOB: Sep 09, 1966, 46 y.o.   MRN: [id number]  HPI  here for follow up hypertension   Past Medical History  Diagnosis Date  . Hyperlipidemia   . Hypertension   . Seizures   . DDD (degenerative disc disease)     lumbar and cervical    Review of Systems  Respiratory: Negative for cough and shortness of breath.   Cardiovascular: Negative for chest pain and palpitations.  Neurological: Negative for dizziness and headaches.       Objective:   Physical Exam BP 160/90  Pulse 76  Temp(Src) 98.6 F (37 C) (Oral)  Ht 6' (1.829 m)  Wt 224 lb (101.606 kg)  BMI 30.38 kg/m2  SpO2 97% Constitutional:  He appears well-developed and well-nourished. No distress.  Neck: Normal range of motion. Neck supple. No JVD present. No thyromegaly present.  Cardiovascular: Normal rate, regular rhythm and normal heart sounds.  No murmur heard. no BLE edema Pulmonary/Chest: Effort normal and breath sounds normal. No respiratory distress. no wheezes. Neurological: he is alert and oriented to person, place, and time. No cranial nerve deficit. Coordination normal.  Psychiatric: he has a normal mood and affect. behavior is normal. Judgment and thought content normal.   Lab Results  Component Value Date   WBC 6.2 02/09/2012   HGB 14.3 02/09/2012   HCT 42.0 02/09/2012   PLT 193.0 02/09/2012   GLUCOSE 98 02/09/2012   CHOL 239* 02/09/2012   TRIG 86.0 02/09/2012   HDL 87.30 02/09/2012   LDLDIRECT 144.2 02/09/2012   ALT 31 02/09/2012   AST 27 02/09/2012   NA 140 02/09/2012   K 4.0 02/09/2012   CL 106 02/09/2012   CREATININE 0.7 02/09/2012   BUN 16 02/09/2012   CO2 25 02/09/2012      Assessment & Plan:  See problem list. Medications and labs reviewed today.

## 2012-02-20 NOTE — Assessment & Plan Note (Signed)
BP Readings from Last 3 Encounters:  02/20/12 160/90  02/09/12 180/110  05/18/10 138/96   Changed from Dilt to Benicar HCT 20-12.5 02/09/2012 Also on low dose atenolol Increase to max dose Benicar HCT now, follow up PCP in 2 weeks

## 2012-02-20 NOTE — Patient Instructions (Signed)
It was good to see you today. Increase to max dose Benicar HCT - one tab daily with atenolol - 3 week samples and "co-pay" coupon card given to you today for this medicine Your prescription(s) have been submitted to your pharmacy. Please take as directed and contact our office if you believe you are having problem(s) with the medication(s). Please schedule followup in 2-4 weeks for recheck BP with Dr Debby Bud, call sooner if problems.

## 2012-02-21 ENCOUNTER — Ambulatory Visit
Admission: RE | Admit: 2012-02-21 | Discharge: 2012-02-21 | Disposition: A | Discharge status: Home / Self Care | Payer: 59 | Source: Ambulatory Visit | Attending: Internal Medicine | Admitting: Internal Medicine

## 2012-02-21 DIAGNOSIS — M5020 Other cervical disc displacement, unspecified cervical region: Secondary | ICD-10-CM

## 2012-02-29 ENCOUNTER — Other Ambulatory Visit: Payer: Self-pay | Admitting: Internal Medicine

## 2012-03-01 ENCOUNTER — Other Ambulatory Visit: Payer: Self-pay | Admitting: Internal Medicine

## 2012-03-01 DIAGNOSIS — M5020 Other cervical disc displacement, unspecified cervical region: Secondary | ICD-10-CM

## 2012-03-02 ENCOUNTER — Telehealth: Payer: Self-pay | Admitting: *Deleted

## 2012-03-02 MED ORDER — HYDROCODONE-ACETAMINOPHEN 10-325 MG PO TABS: 1.0000 | ORAL_TABLET | Freq: Three times a day (TID) | ORAL | Status: DC | PRN

## 2012-03-02 NOTE — Telephone Encounter (Signed)
Patient calling for 2nd time requesting script for Norco.

## 2012-03-02 NOTE — Telephone Encounter (Signed)
Patient is having pain and needing pain meds refilled. Had MRI done on 02/21/2012. Next ov with you on 03/12/2012. Please advise

## 2012-03-02 NOTE — Telephone Encounter (Signed)
Rx called into [facility] 

## 2012-03-12 ENCOUNTER — Encounter: Payer: Self-pay | Admitting: Internal Medicine

## 2012-03-12 ENCOUNTER — Other Ambulatory Visit: Payer: Self-pay | Admitting: *Deleted

## 2012-03-12 ENCOUNTER — Ambulatory Visit (INDEPENDENT_AMBULATORY_CARE_PROVIDER_SITE_OTHER): Payer: 59 | Admitting: Internal Medicine

## 2012-03-12 VITALS — BP 142/100 | HR 75 | Temp 98.3°F | Resp 16 | Ht 66.0 in | Wt 226.0 lb

## 2012-03-12 DIAGNOSIS — M5020 Other cervical disc displacement, unspecified cervical region: Secondary | ICD-10-CM

## 2012-03-12 DIAGNOSIS — I10 Essential (primary) hypertension: Secondary | ICD-10-CM

## 2012-03-12 MED ORDER — ATENOLOL 50 MG PO TABS: 50.0000 mg | ORAL_TABLET | Freq: Every day | ORAL | Status: DC

## 2012-03-12 MED ORDER — HYDROCODONE-ACETAMINOPHEN 10-325 MG PO TABS: 1.0000 | ORAL_TABLET | Freq: Three times a day (TID) | ORAL | Status: DC | PRN

## 2012-03-12 NOTE — Telephone Encounter (Signed)
Pt called regarding Norco refill. He states that he was seen this morning and that refill was supposed to be sent into [facility] on [address] but that pharmacy has not received yet. Please contact pt regarding status of refill.

## 2012-03-12 NOTE — Telephone Encounter (Signed)
Called med to [facility] at 1 pm and gave Rx to pharmacist. Awanda Mink. Patient notified.

## 2012-03-13 NOTE — Progress Notes (Signed)
  Subjective:    Patient ID: Anthony Harrington, male    DOB: June 21, 1966, 46 y.o.   MRN: [id number]  HPI Anthony Harrington has cord and cervical root impingement left by MRI. Reviewed report and images with patient. He has persistent symptoms in his left UE. He is scheduled to see NS next week - hoping for [facility] as treatment  He has been taking his BP meds. Out of office readings with SBP in the 140s. Asymptomatic  Past Medical History  Diagnosis Date  . Hyperlipidemia   . Hypertension   . Seizures   . DDD (degenerative disc disease)     lumbar and cervical   Past Surgical History  Procedure Date  . Fracture surgery     left hip  . Lumbar spine surgery 2003    microdiskectomy L5-S1 Anthony Harrington)   Family History  Problem Relation Age of Onset  . Hypertension Mother   . Diabetes Father   . Heart disease Father   . Hyperlipidemia Father   . Stroke Father   . Diabetes Brother   . Heart disease Brother   . COPD Neg Hx   . Cancer Neg Hx    History   Social History  . Marital Status: Single    Spouse Name: N/A    Number of Children: 0  . Years of Education: 12   Occupational History  . furniture0    Social History Main Topics  . Smoking status: Current Everyday Smoker  . Smokeless tobacco: Never Used  . Alcohol Use: 2.0 oz/week    4 drink(s) per week  . Drug Use: No  . Sexually Active: Yes -- Male partner(s)   Other Topics Concern  . 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 Lives alone.        Review of Systems System review is negative for any constitutional, cardiac, pulmonary, GI or neuro symptoms or complaints other than as described in the HPI.     Objective:   Physical Exam Filed Vitals:   03/12/12 1130  BP: 142/100  Pulse: 75  Temp: 98.3 F (36.8 C)  Resp: 16   Cor - RRR Pulm - normal respirations Neuro - A&O x3,        Assessment & Plan:

## 2012-03-13 NOTE — Assessment & Plan Note (Signed)
Left UE radiculopathy with MRI evidence of left foraminal encroachment C5-6  Plan  NS consult

## 2012-03-13 NOTE — Assessment & Plan Note (Signed)
BP Readings from Last 3 Encounters:  03/12/12 142/100  02/20/12 160/90  02/09/12 180/110   Suboptimal control  Plan - increase atenolol to 50 mg daily  Continue Benicar/HCT 40/25  F/u BP out of office and call in report

## 2012-03-21 ENCOUNTER — Other Ambulatory Visit: Payer: Self-pay | Admitting: Internal Medicine

## 2012-03-21 ENCOUNTER — Other Ambulatory Visit: Payer: Self-pay | Admitting: *Deleted

## 2012-03-21 MED ORDER — HYDROCODONE-ACETAMINOPHEN 10-325 MG PO TABS: 1.0000 | ORAL_TABLET | Freq: Three times a day (TID) | ORAL | Status: DC | PRN

## 2012-03-21 NOTE — Telephone Encounter (Signed)
Patient pain medication called to [facility]. Patient notified

## 2012-03-21 NOTE — Telephone Encounter (Signed)
Patient request refill on pain medication. Norco. He was given quanity of 30 on 03/12/2012. Please advise

## 2012-04-09 ENCOUNTER — Other Ambulatory Visit: Payer: Self-pay | Admitting: Internal Medicine

## 2012-04-09 NOTE — Telephone Encounter (Signed)
Patient is requesting refill for vicodin.

## 2012-04-10 NOTE — Telephone Encounter (Signed)
Left message on voice mail of Rx to [facility]

## 2012-04-10 NOTE — Telephone Encounter (Signed)
Patient notified of norco Rx called to [facility]

## 2012-06-11 ENCOUNTER — Other Ambulatory Visit: Payer: Self-pay | Admitting: Internal Medicine

## 2012-06-11 NOTE — Telephone Encounter (Signed)
PATIENT REQUEST REFILL ON HYDROCODONE-APAP NORCO 10-325MG  .PEASE ADVISE.LAST OV 03/14/2012 MEDICATION REFILL CALLED IN TO [facility] PER APPROVAL OF DR. Debby Bud

## 2012-06-13 NOTE — Telephone Encounter (Signed)
RX CALLED TO [facility]

## 2012-06-13 NOTE — Telephone Encounter (Signed)
Rx picked up by patient at [facility]

## 2012-07-20 ENCOUNTER — Other Ambulatory Visit: Payer: Self-pay | Admitting: Internal Medicine

## 2012-07-22 ENCOUNTER — Other Ambulatory Visit: Payer: Self-pay | Admitting: Internal Medicine

## 2012-07-23 ENCOUNTER — Telehealth: Payer: Self-pay | Admitting: *Deleted

## 2012-07-23 NOTE — Telephone Encounter (Signed)
MEDICATION REFILL CALLED TO [facility]. PATIENT NOTIFIED.

## 2012-08-27 ENCOUNTER — Other Ambulatory Visit: Payer: Self-pay | Admitting: Internal Medicine

## 2012-08-27 NOTE — Telephone Encounter (Signed)
Refill denied. Needs office visit to evaluate cause of pain.

## 2012-08-29 ENCOUNTER — Other Ambulatory Visit: Payer: Self-pay | Admitting: Internal Medicine

## 2012-09-07 ENCOUNTER — Other Ambulatory Visit: Payer: Self-pay | Admitting: Internal Medicine

## 2012-10-02 ENCOUNTER — Other Ambulatory Visit: Payer: Self-pay | Admitting: Internal Medicine

## 2012-10-09 ENCOUNTER — Other Ambulatory Visit: Payer: Self-pay | Admitting: Internal Medicine

## 2012-10-10 ENCOUNTER — Other Ambulatory Visit: Payer: Self-pay | Admitting: Internal Medicine

## 2012-10-22 ENCOUNTER — Other Ambulatory Visit: Payer: Self-pay | Admitting: Internal Medicine

## 2012-10-23 ENCOUNTER — Other Ambulatory Visit: Payer: Self-pay | Admitting: Internal Medicine

## 2012-10-24 ENCOUNTER — Other Ambulatory Visit: Payer: Self-pay | Admitting: Internal Medicine

## 2012-11-27 ENCOUNTER — Other Ambulatory Visit: Payer: Self-pay | Admitting: Internal Medicine

## 2012-12-27 ENCOUNTER — Ambulatory Visit (INDEPENDENT_AMBULATORY_CARE_PROVIDER_SITE_OTHER): Payer: 59 | Admitting: Internal Medicine

## 2012-12-27 ENCOUNTER — Encounter: Payer: Self-pay | Admitting: Internal Medicine

## 2012-12-27 VITALS — BP 160/106 | HR 76 | Temp 98.1°F | Resp 8 | Ht 73.0 in | Wt 231.8 lb

## 2012-12-27 DIAGNOSIS — M5137 Other intervertebral disc degeneration, lumbosacral region: Secondary | ICD-10-CM

## 2012-12-27 DIAGNOSIS — M5136 Other intervertebral disc degeneration, lumbar region: Secondary | ICD-10-CM

## 2012-12-27 DIAGNOSIS — M51369 Other intervertebral disc degeneration, lumbar region without mention of lumbar back pain or lower extremity pain: Secondary | ICD-10-CM

## 2012-12-27 MED ORDER — CYCLOBENZAPRINE HCL 10 MG PO TABS: 10.0000 mg | ORAL_TABLET | Freq: Three times a day (TID) | ORAL | Status: DC | PRN

## 2012-12-27 MED ORDER — PREDNISONE 10 MG PO TABS: 30.0000 mg | ORAL_TABLET | Freq: Every day | ORAL | Status: DC

## 2012-12-27 MED ORDER — OXYCODONE-ACETAMINOPHEN 7.5-325 MG PO TABS: 1.0000 | ORAL_TABLET | Freq: Four times a day (QID) | ORAL | Status: DC | PRN

## 2012-12-27 NOTE — Progress Notes (Signed)
Subjective:    Patient ID: Anthony Harrington, male    DOB: Jun 18, 1966, 47 y.o.   MRN: [id number]  HPI Anthony Harrington reports that he started having some back pain Monday and has had progressive pain on the right back with paresthesia distal leg, increased sensation of foot weakness and his leg has given away with falls x 2 last night.NO incontinence. He has had a disk in the past and this feels the same  Past Medical History  Diagnosis Date  . Hyperlipidemia   . Hypertension   . Seizures   . DDD (degenerative disc disease)     lumbar and cervical   Past Surgical History  Procedure Laterality Date  . Fracture surgery      left hip  . Lumbar spine surgery  2003    microdiskectomy L5-S1 Noel Gerold)   Family History  Problem Relation Age of Onset  . Hypertension Mother   . Diabetes Father   . Heart disease Father   . Hyperlipidemia Father   . Stroke Father   . Diabetes Brother   . Heart disease Brother   . COPD Neg Hx   . Cancer Neg Hx    History   Social History  . Marital Status: Single    Spouse Name: N/A    Number of Children: 0  . Years of Education: 12   Occupational History  . furniture0    Social History Main Topics  . Smoking status: Current Every Day Smoker    Types: Cigarettes  . Smokeless tobacco: Never Used  . Alcohol Use: 2.0 oz/week    4 drink(s) per week  . Drug Use: No  . Sexually Active: Yes -- Male partner(s)   Other Topics Concern  . 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 Lives alone.     Current Outpatient Prescriptions on File Prior to Visit  Medication Sig Dispense Refill  . atenolol (TENORMIN) 50 MG tablet Take 1 tablet (50 mg total) by mouth daily.  30 tablet  11  . olmesartan-hydrochlorothiazide (BENICAR HCT) 40-25 MG per tablet Take 1 tablet by mouth daily.  30 tablet  11  . HYDROcodone-acetaminophen (NORCO) 10-325 MG per tablet TAKE ONE TABLET BY MOUTH  EVERY 8 HOURS AS NEEDED FOR PAIN  90 tablet  0   No current facility-administered medications on file prior to visit.      Review of Systems System review is negative for any constitutional, cardiac, pulmonary, GI or neuro symptoms or complaints other than as described in the HPI.     Objective:   Physical Exam Filed Vitals:   12/27/12 1617  BP: 160/106  Pulse: 76  Temp: 98.1 F (36.7 C)  Resp: 8   Wt Readings from Last 3 Encounters:  12/27/12 231 lb 12.8 oz (105.144 kg)  03/12/12 226 lb (102.513 kg)  02/20/12 224 lb (101.606 kg)   Gen'l- overweight white man who appears uncomfortable HEENT- [place]/AT, C&S clear Cor- 2+ radial pulse, regular Pulm - CTAP Back exam:  Stand favoring right leg, but not assistance needed; normal flex to greater than 100 degrees; abnormal gait - favors right leg, decreased leg swing; difficult to raise up on right toes/heel walk OK; normal step up to exam table with left leg, cannot step up with right leg. normal SLR sitting; absent  DTR at the right patellar tendon, 2+ at left; decreased  sensation to light  touch, pin-prick medial distal right leg; no  CVA tenderness; able to move supine to sitting witout assistance.          Assessment & Plan:

## 2012-12-27 NOTE — Patient Instructions (Addendum)
Looks like you are right: herniated disk (L4-5) on the right side with pain, weakness and loss of reflex  Plan MRI ASAP at [address] tomorrow  Prednisone 30 mg daily  Percocet 7.5/325 every 4-6 hours for pain  Cyclobenzaprine 10 mg three times a day for muscle spasm  We will make an urgent referral to Dr. Noel Gerold

## 2012-12-27 NOTE — Assessment & Plan Note (Signed)
Patient with back pain, right leg weakness with leg pain and cramps, decreased sensation medial distal leg, absent right patellar tendon reflex, poor balance with leg that gave away during exam. Suspect DDD L4-5 right  Plan Prednisone 30 mg daily  Cyclobenzaprine 10 mg tid  Percocet 7.5/325 every 6 hours #30  Urgent referral to Dr. Noel Gerold and associates.

## 2012-12-31 ENCOUNTER — Telehealth: Payer: Self-pay

## 2012-12-31 ENCOUNTER — Ambulatory Visit
Admission: RE | Admit: 2012-12-31 | Discharge: 2012-12-31 | Disposition: A | Discharge status: Home / Self Care | Payer: 59 | Source: Ambulatory Visit | Attending: Internal Medicine | Admitting: Internal Medicine

## 2012-12-31 DIAGNOSIS — M5136 Other intervertebral disc degeneration, lumbar region: Secondary | ICD-10-CM

## 2012-12-31 NOTE — Telephone Encounter (Signed)
1. May shorten interval to q 4 hours. If he needs new Rx - may come by 2. MRI for tonight - sorry it could not have been done sooner 3. Check with Nicole Kindred - he was to be referred to Dr. Noel Gerold on an urgent basis and I don't see an appointment in the system.

## 2012-12-31 NOTE — Telephone Encounter (Signed)
Pt advised and will expect a call from [facility] Debra with appt with Dr Noel Gerold

## 2012-12-31 NOTE — Telephone Encounter (Signed)
Pt called stating that Oxycodone has not been controlling his pain symptoms well. Pt is requesting an alternate medication or dosage adjustment, please advise.

## 2013-01-02 ENCOUNTER — Other Ambulatory Visit: Payer: Self-pay

## 2013-01-02 DIAGNOSIS — M5136 Other intervertebral disc degeneration, lumbar region: Secondary | ICD-10-CM

## 2013-01-02 MED ORDER — OXYCODONE-ACETAMINOPHEN 7.5-325 MG PO TABS: 1.0000 | ORAL_TABLET | Freq: Four times a day (QID) | ORAL | Status: DC | PRN

## 2013-01-02 NOTE — Telephone Encounter (Signed)
The acute pathology appears to be at the L4-5 level where there is  a right posterolateral disc herniation with a fragment migrated  upward in the right lateral recess behind L4. This fragment along  with some disc material in the right foraminal to extraforaminal  region would likely affect the right L4 nerve root at least.  Mild stenosis at the L3-4 level without definite focal neural  compression.  Previous surgery at L4-5 on the left. Chronic hypertrophic bony  changes probably related to that surgery. Foraminal narrowing on  the left because of bony encroachment appears chronic but would  have some potential to affect the exiting L5 nerve root.  L4-5 disk as above with fragment. His referral to Dr. Noel Gerold was approved this AM and he should be hearing from Dr. Wells Guiles office very soon.

## 2013-01-02 NOTE — Telephone Encounter (Signed)
Pt called requesting results of recent MRI 

## 2013-01-02 NOTE — Telephone Encounter (Signed)
Pt advised of result and has been contacted by Dr Wells Guiles with appt in 2 days. Pt is also requesting refill of pain medication, please advise.

## 2013-01-02 NOTE — Addendum Note (Signed)
Addended by: Anselm Jungling on: 01/02/2013 02:02 PM   Modules accepted: Orders

## 2013-01-02 NOTE — Telephone Encounter (Signed)
Pt informed, Rx in cabinet for pt pick up  

## 2013-01-02 NOTE — Telephone Encounter (Signed)
Ok for a refill on percocet, #40

## 2013-01-10 ENCOUNTER — Other Ambulatory Visit: Payer: Self-pay | Admitting: Internal Medicine

## 2013-01-11 ENCOUNTER — Telehealth: Payer: Self-pay

## 2013-01-11 ENCOUNTER — Other Ambulatory Visit: Payer: Self-pay

## 2013-01-11 DIAGNOSIS — M5136 Other intervertebral disc degeneration, lumbar region: Secondary | ICD-10-CM

## 2013-01-11 MED ORDER — OXYCODONE-ACETAMINOPHEN 7.5-325 MG PO TABS: 1.0000 | ORAL_TABLET | Freq: Four times a day (QID) | ORAL | Status: DC | PRN

## 2013-01-11 NOTE — Telephone Encounter (Signed)
Phone call to pt letting him know his prescription is ready to be picked up

## 2013-01-21 ENCOUNTER — Other Ambulatory Visit: Payer: Self-pay

## 2013-01-21 DIAGNOSIS — M5136 Other intervertebral disc degeneration, lumbar region: Secondary | ICD-10-CM

## 2013-01-21 MED ORDER — OXYCODONE-ACETAMINOPHEN 7.5-325 MG PO TABS: 1.0000 | ORAL_TABLET | Freq: Four times a day (QID) | ORAL | Status: DC | PRN

## 2013-01-21 NOTE — Telephone Encounter (Signed)
Pt informed, Rx in cabinet for pt pick up  

## 2013-01-31 ENCOUNTER — Telehealth: Payer: Self-pay | Admitting: *Deleted

## 2013-01-31 DIAGNOSIS — M5136 Other intervertebral disc degeneration, lumbar region: Secondary | ICD-10-CM

## 2013-01-31 MED ORDER — OXYCODONE-ACETAMINOPHEN 7.5-325 MG PO TABS: 1.0000 | ORAL_TABLET | Freq: Four times a day (QID) | ORAL | Status: DC | PRN

## 2013-01-31 NOTE — Telephone Encounter (Signed)
Pt left vm req rf on his Percocet. Last fill per EMR was #40 on 01/21/13. Please advise in PCP absence.

## 2013-01-31 NOTE — Telephone Encounter (Signed)
done

## 2013-01-31 NOTE — Telephone Encounter (Signed)
Rx up front. Pt informed.

## 2013-02-08 ENCOUNTER — Telehealth: Payer: Self-pay | Admitting: Internal Medicine

## 2013-02-08 DIAGNOSIS — M5136 Other intervertebral disc degeneration, lumbar region: Secondary | ICD-10-CM

## 2013-02-08 NOTE — Telephone Encounter (Signed)
It was filled on 5/1 Thx

## 2013-02-08 NOTE — Telephone Encounter (Signed)
Pt was advised on 12/31/12 by Dr. Debby Bud to increase to Q 4 hours. He only has #5 left. Please advise.

## 2013-02-08 NOTE — Telephone Encounter (Signed)
Pt called req refill for Percocet. Last ov with Dr. Debby Bud was 12/27/12. Please advise.

## 2013-02-08 NOTE — Telephone Encounter (Signed)
Ok #60 no ref OV w/Dr Norins or another provider this month Thx

## 2013-02-11 ENCOUNTER — Other Ambulatory Visit: Payer: Self-pay

## 2013-02-11 DIAGNOSIS — M5136 Other intervertebral disc degeneration, lumbar region: Secondary | ICD-10-CM

## 2013-02-11 MED ORDER — OXYCODONE-ACETAMINOPHEN 7.5-325 MG PO TABS: 1.0000 | ORAL_TABLET | ORAL | Status: DC

## 2013-02-11 NOTE — Telephone Encounter (Signed)
Phone call to pt letting him know he can pick up his script.

## 2013-02-11 NOTE — Telephone Encounter (Signed)
This was taken care of by Dr Plotnikov(see other message).

## 2013-02-11 NOTE — Telephone Encounter (Signed)
no refill - just filled <30d ago (per protocol covering for absent PCP) -

## 2013-02-11 NOTE — Telephone Encounter (Signed)
Phone call from pt requesting a refill on his Percocet 7.5/325 mg. He states instructions Dr Debby Bud gave him were to take 1 tablet every 4 hours ( not every 6 hours). Please advise.

## 2013-02-11 NOTE — Telephone Encounter (Signed)
Printed and waiting for MD signature

## 2013-02-26 ENCOUNTER — Other Ambulatory Visit: Payer: Self-pay | Admitting: Internal Medicine

## 2013-03-01 ENCOUNTER — Telehealth: Payer: Self-pay

## 2013-03-01 MED ORDER — HYDROCODONE-ACETAMINOPHEN 10-325 MG PO TABS: ORAL_TABLET | ORAL | Status: DC

## 2013-03-01 NOTE — Telephone Encounter (Signed)
Phone call from patient requesting a medication for his back pain be called to his pharmacy. He wouldn't be able to pick up a Percocet script. He states his back doctor did prescribe Lyrica but that is to expensive for him. He says he has nothing for the weekend. Please advise.

## 2013-03-01 NOTE — Telephone Encounter (Signed)
Called patient - no answer. Left msg: he should not call late Friday; cannot do refill for percocet this weekend; Rx for Norco 10/325 [facility]; he may call his back doctor for additional help and should not have two prescribers; for unbearable pain he will need to go to the ED.

## 2013-03-05 ENCOUNTER — Other Ambulatory Visit: Payer: Self-pay | Admitting: Internal Medicine

## 2013-03-07 ENCOUNTER — Other Ambulatory Visit: Payer: Self-pay

## 2013-03-07 MED ORDER — OLMESARTAN MEDOXOMIL-HCTZ 40-25 MG PO TABS: 1.0000 | ORAL_TABLET | Freq: Every day | ORAL | Status: DC

## 2013-03-08 ENCOUNTER — Telehealth: Payer: Self-pay

## 2013-03-08 NOTE — Telephone Encounter (Signed)
Phone call from patient stating prior authorization is required on the Benicar HCT 40-25 mg. Do you want to switch to a different medication? Or just proceed with the prior authorization? Please advise.

## 2013-03-08 NOTE — Telephone Encounter (Signed)
Prefer to go with formulary

## 2013-03-08 NOTE — Telephone Encounter (Addendum)
PA being filled out ; I let patient know I left samples at the front desk

## 2013-03-11 ENCOUNTER — Telehealth: Payer: Self-pay

## 2013-03-11 MED ORDER — LOSARTAN POTASSIUM-HCTZ 100-25 MG PO TABS: 1.0000 | ORAL_TABLET | Freq: Every day | ORAL | Status: DC

## 2013-03-11 NOTE — Telephone Encounter (Signed)
Pt aware Dr Debby Bud switched medication to Losartan/HCT 100-25 mg. Prescription sent to pharmacy.

## 2013-03-11 NOTE — Telephone Encounter (Signed)
Phone call to pt letting him know Dr Debby Bud is switching his medication from Benicar HCT to Losartan HCT.

## 2013-03-20 ENCOUNTER — Other Ambulatory Visit: Payer: Self-pay | Admitting: Internal Medicine

## 2013-03-21 NOTE — Telephone Encounter (Signed)
Reviewed previous phone notes and requests for med refills. At last refill he was told that   1) he needs to check back with [facility] where he is being treated for his back pain;   2) he was told that he would need an office visit before any additional refills for hydrocodone  He needs to call [facility] today or see someone at our office today or tomorrow.

## 2013-03-25 ENCOUNTER — Other Ambulatory Visit: Payer: Self-pay | Admitting: Internal Medicine

## 2013-03-26 ENCOUNTER — Telehealth: Payer: Self-pay | Admitting: *Deleted

## 2013-03-26 NOTE — Telephone Encounter (Signed)
Pt called states he received a message from someone in Dr Debby Bud office to return their call.  He further states they did not leave their name.  Please advise

## 2013-03-27 NOTE — Telephone Encounter (Signed)
I know that Georgiann Hahn was trying to contact Mr. Aldava - his request for narcotics was turned down by me. He was told May 30 in phone note to have single prescriber, to see his back pain doctor or he will need OV and reassessment if he is no longer seeing back pain doctor.  thanks

## 2013-03-27 NOTE — Telephone Encounter (Signed)
See phone note May 30th. He has been seeing a doctor for his back pain and I have requested that he have a single prescriber. No refill on controlled substance until OV and communication from his back doctor about diagnosis and plan of treatment.

## 2013-03-28 NOTE — Telephone Encounter (Signed)
Message closed. 

## 2013-04-04 ENCOUNTER — Other Ambulatory Visit: Payer: Self-pay | Admitting: Internal Medicine

## 2013-04-22 ENCOUNTER — Other Ambulatory Visit: Payer: Self-pay | Admitting: Internal Medicine

## 2013-06-03 ENCOUNTER — Other Ambulatory Visit: Payer: Self-pay | Admitting: Internal Medicine

## 2013-08-08 ENCOUNTER — Other Ambulatory Visit: Payer: Self-pay

## 2013-09-09 ENCOUNTER — Other Ambulatory Visit: Payer: Self-pay | Admitting: Internal Medicine

## 2013-12-05 ENCOUNTER — Other Ambulatory Visit: Payer: Self-pay | Admitting: Internal Medicine

## 2014-03-12 ENCOUNTER — Ambulatory Visit (INDEPENDENT_AMBULATORY_CARE_PROVIDER_SITE_OTHER): Payer: Managed Care, Other (non HMO) | Admitting: Internal Medicine

## 2014-03-12 ENCOUNTER — Encounter: Payer: Self-pay | Admitting: Internal Medicine

## 2014-03-12 ENCOUNTER — Other Ambulatory Visit (INDEPENDENT_AMBULATORY_CARE_PROVIDER_SITE_OTHER): Payer: Managed Care, Other (non HMO)

## 2014-03-12 VITALS — BP 138/104 | HR 119 | Temp 98.4°F | Wt 256.4 lb

## 2014-03-12 DIAGNOSIS — M545 Low back pain, unspecified: Secondary | ICD-10-CM

## 2014-03-12 DIAGNOSIS — R000 Tachycardia, unspecified: Secondary | ICD-10-CM

## 2014-03-12 DIAGNOSIS — F17200 Nicotine dependence, unspecified, uncomplicated: Secondary | ICD-10-CM

## 2014-03-12 DIAGNOSIS — I10 Essential (primary) hypertension: Secondary | ICD-10-CM

## 2014-03-12 MED ORDER — HYDROCODONE-ACETAMINOPHEN 10-325 MG PO TABS: ORAL_TABLET | ORAL | Status: DC

## 2014-03-12 MED ORDER — METOPROLOL TARTRATE 25 MG PO TABS: 25.0000 mg | ORAL_TABLET | Freq: Two times a day (BID) | ORAL | Status: DC

## 2014-03-12 NOTE — Patient Instructions (Signed)
Your next office appointment will be determined based upon review of your pending labs . Those instructions will be transmitted to you through My Chart  OR  by mail;whichever process is your choice to receive results & recommendations .  Minimal Blood Pressure Goal= AVERAGE < 140/90;  Ideal is an AVERAGE < 135/85. This AVERAGE should be calculated from @ least 5-7 BP readings taken @ different times of day on different days of week. You should not respond to isolated BP readings , but rather the AVERAGE for that week .Please bring your  blood pressure cuff to office visits to verify that it is reliable.It  can also be checked against the blood pressure device at the pharmacy. Finger or wrist cuffs are not dependable; an arm cuff is.   Please think about quitting smoking. Review the risks we discussed. Please call 1-800-QUIT-NOW [phone]) for free smoking cessation counseling.

## 2014-03-12 NOTE — Progress Notes (Signed)
   Subjective:    Patient ID: Anthony Harrington, male    DOB: May 23, 1966, 48 y.o.   MRN: [id number]  HPI  He has run out of his atenolol and did not take his losartan/HCT today. He does have refills left on that BP pill.  He denies any adverse effects from the medicines.  Three weeks ago he was putting sheets on his bed when he experienced acute right low back pain with radiation into the right leg. The back pain has prevented sleep since 3 this morning. He has an appointment to followup with his back specialist 6/18.  He has had an epidural steroid injection for that. He also been on prednisone 10 mg 3 times a day in the past.  He continues to smoke a pack a day.    Review of Systems   Chest pain, palpitations, tachycardia, exertional dyspnea, paroxysmal nocturnal dyspnea, claudication or edema are absent.       Objective:   Physical Exam Appears  well-nourished & in no acute distress. Central weight excess  No carotid bruits are present.No neck pain distention present at 10 - 15 degrees. Thyroid normal to palpation  Tachycardia with slight flow murmur noted.  Chest is clear with no increased work of breathing  There is no evidence of aortic aneurysm or renal artery bruits  Abdomen soft with no organomegaly or masses. No HJR  No clubbing, cyanosis or edema present.  Pedal pulses are intact   No ischemic skin changes are present . Fingernails healthy   Alert and oriented. Strength, tone normal. DTRs reflexes decreased in the right lower extremity  Skin slightly diaphoretic. There are scattered excoriated lesions over the forearms  He is able to walk on his heels and toes. He limps slightly on the right. There is negative straight leg raising to 90.            Assessment & Plan:  #1 hypertension, uncontrolled. Non compliance with medications.Home blood pressure monitor encouraged.  #2 tachycardia in the setting of #3. EKG reveals a tachycardia with rate of 120.  He has poor R wave progression across precordium without ischemic changes. A copy of EKG was given to him and explained; risk stratification reinforced. The risk related to his uncontrolled blood pressure (6 times normal) and smoking (doubling) were discussed.    #3 acute low back pain, recurrent  #4 active smoker   See orders

## 2014-03-12 NOTE — Progress Notes (Signed)
Pre visit review using our clinic review tool, if applicable. No additional management support is needed unless otherwise documented below in the visit note. 

## 2014-03-13 LAB — BASIC METABOLIC PANEL
BUN: 25 mg/dL — ABNORMAL HIGH (ref 6–23)
CHLORIDE: 105 meq/L (ref 96–112)
CO2: 29 meq/L (ref 19–32)
Calcium: 10.9 mg/dL — ABNORMAL HIGH (ref 8.4–10.5)
Creatinine, Ser: 0.8 mg/dL (ref 0.4–1.5)
GFR: 110.26 mL/min (ref >60.00)
GLUCOSE: 98 mg/dL (ref 70–99)
Potassium: 4 mEq/L (ref 3.5–5.1)
SODIUM: 141 meq/L (ref 135–145)

## 2014-03-13 LAB — T4, FREE: FREE T4: 0.77 ng/dL (ref 0.60–1.60)

## 2014-03-13 LAB — TROPONIN I: Troponin I: 0.01 ng/mL (ref <0.06)

## 2014-03-13 LAB — TSH: TSH: 4.23 u[IU]/mL (ref 0.35–4.50)

## 2014-03-15 ENCOUNTER — Other Ambulatory Visit: Payer: Self-pay | Admitting: Internal Medicine

## 2014-04-16 ENCOUNTER — Telehealth: Payer: Self-pay | Admitting: Internal Medicine

## 2014-04-16 DIAGNOSIS — M545 Low back pain, unspecified: Secondary | ICD-10-CM

## 2014-04-16 MED ORDER — HYDROCODONE-ACETAMINOPHEN 10-325 MG PO TABS: ORAL_TABLET | ORAL | Status: DC

## 2014-04-16 NOTE — Telephone Encounter (Signed)
OK X1 

## 2014-04-16 NOTE — Telephone Encounter (Signed)
Pt refill for Norco for his back. Please advise, pt scheduled with Dr. Dorise HissKollar 06/19/14.

## 2014-04-16 NOTE — Telephone Encounter (Signed)
Called pt no answer LMOM rx ready for pick-up.../lmb 

## 2014-05-16 ENCOUNTER — Other Ambulatory Visit: Payer: Self-pay | Admitting: Internal Medicine

## 2014-05-16 DIAGNOSIS — M545 Low back pain, unspecified: Secondary | ICD-10-CM

## 2014-05-16 NOTE — Telephone Encounter (Signed)
Patient is requesting refill on norco °

## 2014-05-19 MED ORDER — HYDROCODONE-ACETAMINOPHEN 10-325 MG PO TABS: ORAL_TABLET | ORAL | Status: DC

## 2014-05-19 NOTE — Telephone Encounter (Signed)
OK X 1  Needs to establish with new PCP 

## 2014-05-19 NOTE — Telephone Encounter (Signed)
Notified pt rx ready for pick-up.../lmb 

## 2014-06-10 ENCOUNTER — Other Ambulatory Visit: Payer: Self-pay | Admitting: Geriatric Medicine

## 2014-06-10 MED ORDER — LOSARTAN POTASSIUM-HCTZ 100-25 MG PO TABS: ORAL_TABLET | ORAL | Status: DC

## 2014-06-19 ENCOUNTER — Ambulatory Visit: Payer: Managed Care, Other (non HMO) | Admitting: Internal Medicine

## 2014-07-25 ENCOUNTER — Ambulatory Visit (INDEPENDENT_AMBULATORY_CARE_PROVIDER_SITE_OTHER): Payer: Managed Care, Other (non HMO) | Admitting: Internal Medicine

## 2014-07-25 ENCOUNTER — Encounter: Payer: Self-pay | Admitting: Internal Medicine

## 2014-07-25 VITALS — BP 132/94 | HR 99 | Temp 98.1°F | Resp 20 | Ht 73.0 in | Wt 258.8 lb

## 2014-07-25 DIAGNOSIS — I10 Essential (primary) hypertension: Secondary | ICD-10-CM

## 2014-07-25 DIAGNOSIS — G40909 Epilepsy, unspecified, not intractable, without status epilepticus: Secondary | ICD-10-CM

## 2014-07-25 DIAGNOSIS — Z720 Tobacco use: Secondary | ICD-10-CM

## 2014-07-25 DIAGNOSIS — F17200 Nicotine dependence, unspecified, uncomplicated: Secondary | ICD-10-CM

## 2014-07-25 DIAGNOSIS — F39 Unspecified mood [affective] disorder: Secondary | ICD-10-CM

## 2014-07-25 MED ORDER — DULOXETINE HCL 30 MG PO CPEP: 30.0000 mg | ORAL_CAPSULE | Freq: Every day | ORAL | Status: DC

## 2014-07-25 NOTE — Progress Notes (Signed)
Pre visit review using our clinic review tool, if applicable. No additional management support is needed unless otherwise documented below in the visit note. 

## 2014-07-25 NOTE — Patient Instructions (Signed)
We will have you start a medicine called cymbalta (also called duloxetine) that should help with your mood and nervous energy. Take 1 pill a day with food to help decrease the risk of nausea with the medicine. Call us back in about 3 weeks and let us know if you are doing well. If you are doing well with the medicine we will change you to the full dose of the medicine.   We will see you back in about 3-6 months to check on your mood and see how you are doing.   Generalized Anxiety Disorder Generalized anxiety disorder (GAD) is a mental disorder. It interferes with life functions, including relationships, work, and school. GAD is different from normal anxiety, which everyone experiences at some point in their lives in response to specific life events and activities. Normal anxiety actually helps us prepare for and get through these life events and activities. Normal anxiety goes away after the event or activity is over.  GAD causes anxiety that is not necessarily related to specific events or activities. It also causes excess anxiety in proportion to specific events or activities. The anxiety associated with GAD is also difficult to control. GAD can vary from mild to severe. People with severe GAD can have intense waves of anxiety with physical symptoms (panic attacks).  SYMPTOMS The anxiety and worry associated with GAD are difficult to control. This anxiety and worry are related to many life events and activities and also occur more days than not for 6 months or longer. People with GAD also have three or more of the following symptoms (one or more in children):  Restlessness.   Fatigue.  Difficulty concentrating.   Irritability.  Muscle tension.  Difficulty sleeping or unsatisfying sleep. DIAGNOSIS GAD is diagnosed through an assessment by your health care provider. Your health care provider will ask you questions aboutyour mood,physical symptoms, and events in your life. Your health care  provider may ask you about your medical history and use of alcohol or drugs, including prescription medicines. Your health care provider may also do a physical exam and blood tests. Certain medical conditions and the use of certain substances can cause symptoms similar to those associated with GAD. Your health care provider may refer you to a mental health specialist for further evaluation. TREATMENT The following therapies are usually used to treat GAD:   Medication. Antidepressant medication usually is prescribed for long-term daily control. Antianxiety medicines may be added in severe cases, especially when panic attacks occur.   Talk therapy (psychotherapy). Certain types of talk therapy can be helpful in treating GAD by providing support, education, and guidance. A form of talk therapy called cognitive behavioral therapy can teach you healthy ways to think about and react to daily life events and activities.  Stress managementtechniques. These include yoga, meditation, and exercise and can be very helpful when they are practiced regularly. A mental health specialist can help determine which treatment is best for you. Some people see improvement with one therapy. However, other people require a combination of therapies. Document Released: 01/14/2013 Document Revised: 02/03/2014 Document Reviewed: 01/14/2013 [facility] Patient Information 2015 [place]. This information is not intended to replace advice given to you by your health care provider. Make sure you discuss any questions you have with your health care provider.

## 2014-07-28 DIAGNOSIS — F39 Unspecified mood [affective] disorder: Secondary | ICD-10-CM | POA: Insufficient documentation

## 2014-07-28 NOTE — Progress Notes (Signed)
   Subjective:    Patient ID: Anthony RodneyDanny R Bowersox, male    DOB: 08-18-1966, 48 y.o.   MRN: [id number]  HPI The patient is a 48 YO man who is coming in today to establish care. He has PMH of tobacco abuse, HTN, cervical back pain, hyperlipidemia. He is doing well overall but still having some troubles with back pain and sharp radiating pains in his shoulder. He is having some dysphoric mood. He denies SI/HI. He denies chest pains, SOB, abdominal pain.   Review of Systems  Constitutional: Negative for fever, activity change, appetite change, fatigue and unexpected weight change.  HENT: Negative.   Respiratory: Negative for cough, chest tightness, shortness of breath and wheezing.   Cardiovascular: Negative for chest pain, palpitations and leg swelling.  Gastrointestinal: Negative for abdominal pain, diarrhea, constipation and abdominal distention.  Musculoskeletal: Positive for arthralgias and back pain.  Neurological: Positive for numbness.  Psychiatric/Behavioral: Positive for dysphoric mood. Negative for suicidal ideas, sleep disturbance, self-injury and decreased concentration.      Objective:   Physical Exam  Constitutional: He is oriented to person, place, and time. He appears well-developed and well-nourished. No distress.  Overweight  HENT:  Head: Normocephalic and atraumatic.  Eyes: EOM are normal.  Neck: Normal range of motion.  Cardiovascular: Normal rate and regular rhythm.   Pulmonary/Chest: Effort normal and breath sounds normal. No respiratory distress. He has no wheezes. He has no rales.  Abdominal: Soft. Bowel sounds are normal.  Neurological: He is alert and oriented to person, place, and time. Coordination normal.  Skin: Skin is warm and dry.   Filed Vitals:   07/25/14 1559  BP: 132/94  Pulse: 99  Temp: 98.1 F (36.7 C)  TempSrc: Oral  Resp: 20  Height: 6\' 1"  (1.854 m)  Weight: 258 lb 12.8 oz (117.391 kg)  SpO2: 96%      Assessment & Plan:

## 2014-07-28 NOTE — Assessment & Plan Note (Signed)
Will trial cymbalta for his mood as well as neuropathic pain from his back. He will call back in about 3 weeks and if going well we switch to full dosing.

## 2014-07-28 NOTE — Assessment & Plan Note (Signed)
Unclear history and he is not on meds at this time and denies recent seizure.

## 2014-07-28 NOTE — Assessment & Plan Note (Signed)
Filed Vitals:   07/25/14 1559  BP: 132/94  Pulse: 99  Temp: 98.1 F (36.7 C)  Resp: 20   BP well controlled today and will continue metoprolol and losartan HCTZ.

## 2014-07-28 NOTE — Assessment & Plan Note (Signed)
No intention to quit at this time.

## 2014-08-18 ENCOUNTER — Telehealth: Payer: Self-pay | Admitting: Internal Medicine

## 2014-08-18 NOTE — Telephone Encounter (Signed)
Please give patient a call back in regards to meds Dr. Dorise Hisskollar recently put him on.

## 2014-08-19 MED ORDER — DULOXETINE HCL 60 MG PO CPEP: 60.0000 mg | ORAL_CAPSULE | Freq: Every day | ORAL | Status: DC

## 2014-08-19 MED ORDER — HYDROCODONE-ACETAMINOPHEN 10-325 MG PO TABS: 1.0000 | ORAL_TABLET | Freq: Two times a day (BID) | ORAL | Status: DC | PRN

## 2014-08-19 NOTE — Telephone Encounter (Signed)
Left msg on triage stating md told him to call when he was almost finish with the fluoxetine to see if med is working for him if not she would increase the dosage. Pt states he haven't notice a difference. Also pt is requesting refill on his Norco.../lmb

## 2014-08-19 NOTE — Telephone Encounter (Signed)
Notified pt with md response.../lmb 

## 2014-08-19 NOTE — Telephone Encounter (Signed)
He will go up to 60 mg cymalta and he should keep taking. Have printed norco.

## 2014-08-29 ENCOUNTER — Other Ambulatory Visit: Payer: Self-pay | Admitting: Internal Medicine

## 2014-09-01 ENCOUNTER — Other Ambulatory Visit: Payer: Self-pay | Admitting: Geriatric Medicine

## 2014-09-01 ENCOUNTER — Telehealth: Payer: Self-pay | Admitting: Internal Medicine

## 2014-09-01 ENCOUNTER — Other Ambulatory Visit: Payer: Self-pay | Admitting: Internal Medicine

## 2014-09-01 DIAGNOSIS — I10 Essential (primary) hypertension: Secondary | ICD-10-CM

## 2014-09-01 MED ORDER — LOSARTAN POTASSIUM-HCTZ 100-25 MG PO TABS: ORAL_TABLET | ORAL | Status: DC

## 2014-09-01 MED ORDER — METOPROLOL TARTRATE 25 MG PO TABS: 25.0000 mg | ORAL_TABLET | Freq: Two times a day (BID) | ORAL | Status: DC

## 2014-09-01 NOTE — Telephone Encounter (Signed)
Pt requesting his BP med be refilled he is out of it today, [facility] sent request 11/27, Dr Dorise HissKollar out of office today.

## 2014-09-01 NOTE — Telephone Encounter (Signed)
OK X1 mo 

## 2014-09-01 NOTE — Telephone Encounter (Signed)
Losartan HCTZ sent to [facility].

## 2014-09-04 ENCOUNTER — Other Ambulatory Visit: Payer: Self-pay | Admitting: Geriatric Medicine

## 2014-09-04 DIAGNOSIS — I10 Essential (primary) hypertension: Secondary | ICD-10-CM

## 2014-09-04 MED ORDER — METOPROLOL TARTRATE 25 MG PO TABS: 25.0000 mg | ORAL_TABLET | Freq: Two times a day (BID) | ORAL | Status: DC

## 2014-09-16 ENCOUNTER — Other Ambulatory Visit: Payer: Self-pay | Admitting: Geriatric Medicine

## 2014-09-16 ENCOUNTER — Telehealth: Payer: Self-pay | Admitting: Internal Medicine

## 2014-09-16 MED ORDER — HYDROCODONE-ACETAMINOPHEN 10-325 MG PO TABS: 1.0000 | ORAL_TABLET | Freq: Two times a day (BID) | ORAL | Status: DC | PRN

## 2014-09-16 NOTE — Telephone Encounter (Signed)
Pt request refill for Norco. Please advise.

## 2014-09-16 NOTE — Telephone Encounter (Signed)
Printed and placed up front for patient to pick up. Patient aware.

## 2014-10-20 ENCOUNTER — Other Ambulatory Visit: Payer: Self-pay

## 2014-10-20 MED ORDER — HYDROCODONE-ACETAMINOPHEN 10-325 MG PO TABS: 1.0000 | ORAL_TABLET | Freq: Two times a day (BID) | ORAL | Status: DC | PRN

## 2014-10-20 NOTE — Telephone Encounter (Signed)
Pt lm on triage  Rx refill of Norco requested.   Pended and sent request to PCP for review.

## 2014-11-17 ENCOUNTER — Telehealth: Payer: Self-pay | Admitting: Internal Medicine

## 2014-11-17 ENCOUNTER — Other Ambulatory Visit: Payer: Self-pay | Admitting: Geriatric Medicine

## 2014-11-17 NOTE — Telephone Encounter (Signed)
Is requesting script for hydrocodone.  Patient would like a call back at [phone] when ready for pickup.

## 2014-11-17 NOTE — Telephone Encounter (Signed)
Spoke with patient and informed him he could pick up on Wednesday at the earliest.

## 2014-11-19 ENCOUNTER — Other Ambulatory Visit: Payer: Self-pay | Admitting: Geriatric Medicine

## 2014-11-19 MED ORDER — HYDROCODONE-ACETAMINOPHEN 10-325 MG PO TABS: 1.0000 | ORAL_TABLET | Freq: Two times a day (BID) | ORAL | Status: DC | PRN

## 2014-12-29 ENCOUNTER — Telehealth: Payer: Self-pay | Admitting: Internal Medicine

## 2014-12-29 ENCOUNTER — Other Ambulatory Visit: Payer: Self-pay | Admitting: Geriatric Medicine

## 2014-12-29 NOTE — Telephone Encounter (Signed)
Patient requesting refill of HYDROcodone-acetaminophen (NORCO) 10-325 MG per tablet

## 2014-12-29 NOTE — Telephone Encounter (Signed)
Patient must come in before Dr. Dorise HissKollar will refill. Patient aware.

## 2015-01-01 ENCOUNTER — Ambulatory Visit: Payer: Managed Care, Other (non HMO) | Admitting: Internal Medicine

## 2015-01-26 NOTE — Telephone Encounter (Signed)
Patient called in and make appointment

## 2015-01-30 ENCOUNTER — Encounter: Payer: Self-pay | Admitting: Internal Medicine

## 2015-01-30 ENCOUNTER — Ambulatory Visit (INDEPENDENT_AMBULATORY_CARE_PROVIDER_SITE_OTHER): Payer: Managed Care, Other (non HMO) | Admitting: Internal Medicine

## 2015-01-30 VITALS — BP 128/80 | HR 101 | Temp 98.3°F | Resp 16 | Ht 72.0 in | Wt 257.8 lb

## 2015-01-30 DIAGNOSIS — M5020 Other cervical disc displacement, unspecified cervical region: Secondary | ICD-10-CM | POA: Diagnosis not present

## 2015-01-30 MED ORDER — HYDROCODONE-ACETAMINOPHEN 10-325 MG PO TABS: 1.0000 | ORAL_TABLET | Freq: Two times a day (BID) | ORAL | Status: DC | PRN

## 2015-01-30 MED ORDER — FLUTICASONE PROPIONATE 50 MCG/ACT NA SUSP: 2.0000 | Freq: Every day | NASAL | Status: DC

## 2015-01-30 NOTE — Patient Instructions (Addendum)
We have refilled the back medicine for you.   We have also sent in flonase for the allergies. Use 2 puffs in each nostril once a day. It should start working in the next 2-3 days and the cough should clear up in the next 1-2 weeks and generally lasts a little longer than the allergy congestion.   Plantar Fasciitis (Heel Spur Syndrome) with Rehab The plantar fascia is a fibrous, ligament-like, soft-tissue structure that spans the bottom of the foot. Plantar fasciitis is a condition that causes pain in the foot due to inflammation of the tissue. SYMPTOMS   Pain and tenderness on the underneath side of the foot.  Pain that worsens with standing or walking. CAUSES  Plantar fasciitis is caused by irritation and injury to the plantar fascia on the underneath side of the foot. Common mechanisms of injury include:  Direct trauma to bottom of the foot.  Damage to a small nerve that runs under the foot where the main fascia attaches to the heel bone.  Stress placed on the plantar fascia due to bone spurs. RISK INCREASES WITH:   Activities that place stress on the plantar fascia (running, jumping, pivoting, or cutting).  Poor strength and flexibility.  Improperly fitted shoes.  Tight calf muscles.  Flat feet.  Failure to warm-up properly before activity.  Obesity. PREVENTION  Warm up and stretch properly before activity.  Allow for adequate recovery between workouts.  Maintain physical fitness:  Strength, flexibility, and endurance.  Cardiovascular fitness.  Maintain a health body weight.  Avoid stress on the plantar fascia.  Wear properly fitted shoes, including arch supports for individuals who have flat feet. PROGNOSIS  If treated properly, then the symptoms of plantar fasciitis usually resolve without surgery. However, occasionally surgery is necessary. RELATED COMPLICATIONS   Recurrent symptoms that may result in a chronic condition.  Problems of the lower back  that are caused by compensating for the injury, such as limping.  Pain or weakness of the foot during push-off following surgery.  Chronic inflammation, scarring, and partial or complete fascia tear, occurring more often from repeated injections. TREATMENT  Treatment initially involves the use of ice and medication to help reduce pain and inflammation. The use of strengthening and stretching exercises may help reduce pain with activity, especially stretches of the Achilles tendon. These exercises may be performed at home or with a therapist. Your caregiver may recommend that you use heel cups of arch supports to help reduce stress on the plantar fascia. Occasionally, corticosteroid injections are given to reduce inflammation. If symptoms persist for greater than 6 months despite non-surgical (conservative), then surgery may be recommended.  MEDICATION   If pain medication is necessary, then nonsteroidal anti-inflammatory medications, such as aspirin and ibuprofen, or other minor pain relievers, such as acetaminophen, are often recommended.  Do not take pain medication within 7 days before surgery.  Prescription pain relievers may be given if deemed necessary by your caregiver. Use only as directed and only as much as you need.  Corticosteroid injections may be given by your caregiver. These injections should be reserved for the most serious cases, because they may only be given a certain number of times. HEAT AND COLD  Cold treatment (icing) relieves pain and reduces inflammation. Cold treatment should be applied for 10 to 15 minutes every 2 to 3 hours for inflammation and pain and immediately after any activity that aggravates your symptoms. Use ice packs or massage the area with a piece of ice (ice  massage).  Heat treatment may be used prior to performing the stretching and strengthening activities prescribed by your caregiver, physical therapist, or athletic trainer. Use a heat pack or soak  the injury in warm water. SEEK IMMEDIATE MEDICAL CARE IF:  Treatment seems to offer no benefit, or the condition worsens.  Any medications produce adverse side effects. EXERCISES RANGE OF MOTION (ROM) AND STRETCHING EXERCISES - Plantar Fasciitis (Heel Spur Syndrome) These exercises may help you when beginning to rehabilitate your injury. Your symptoms may resolve with or without further involvement from your physician, physical therapist or athletic trainer. While completing these exercises, remember:   Restoring tissue flexibility helps normal motion to return to the joints. This allows healthier, less painful movement and activity.  An effective stretch should be held for at least 30 seconds.  A stretch should never be painful. You should only feel a gentle lengthening or release in the stretched tissue. RANGE OF MOTION - Toe Extension, Flexion  Sit with your right / left leg crossed over your opposite knee.  Grasp your toes and gently pull them back toward the top of your foot. You should feel a stretch on the bottom of your toes and/or foot.  Hold this stretch for __________ seconds.  Now, gently pull your toes toward the bottom of your foot. You should feel a stretch on the top of your toes and or foot.  Hold this stretch for __________ seconds. Repeat __________ times. Complete this stretch __________ times per day.  RANGE OF MOTION - Ankle Dorsiflexion, Active Assisted  Remove shoes and sit on a chair that is preferably not on a carpeted surface.  Place right / left foot under knee. Extend your opposite leg for support.  Keeping your heel down, slide your right / left foot back toward the chair until you feel a stretch at your ankle or calf. If you do not feel a stretch, slide your bottom forward to the edge of the chair, while still keeping your heel down.  Hold this stretch for __________ seconds. Repeat __________ times. Complete this stretch __________ times per day.   STRETCH - Gastroc, Standing  Place hands on wall.  Extend right / left leg, keeping the front knee somewhat bent.  Slightly point your toes inward on your back foot.  Keeping your right / left heel on the floor and your knee straight, shift your weight toward the wall, not allowing your back to arch.  You should feel a gentle stretch in the right / left calf. Hold this position for __________ seconds. Repeat __________ times. Complete this stretch __________ times per day. STRETCH - Soleus, Standing  Place hands on wall.  Extend right / left leg, keeping the other knee somewhat bent.  Slightly point your toes inward on your back foot.  Keep your right / left heel on the floor, bend your back knee, and slightly shift your weight over the back leg so that you feel a gentle stretch deep in your back calf.  Hold this position for __________ seconds. Repeat __________ times. Complete this stretch __________ times per day. STRETCH - Gastrocsoleus, Standing  Note: This exercise can place a lot of stress on your foot and ankle. Please complete this exercise only if specifically instructed by your caregiver.   Place the ball of your right / left foot on a step, keeping your other foot firmly on the same step.  Hold on to the wall or a rail for balance.  Slowly lift your  other foot, allowing your body weight to press your heel down over the edge of the step.  You should feel a stretch in your right / left calf.  Hold this position for __________ seconds.  Repeat this exercise with a slight bend in your right / left knee. Repeat __________ times. Complete this stretch __________ times per day.  STRENGTHENING EXERCISES - Plantar Fasciitis (Heel Spur Syndrome)  These exercises may help you when beginning to rehabilitate your injury. They may resolve your symptoms with or without further involvement from your physician, physical therapist or athletic trainer. While completing these  exercises, remember:   Muscles can gain both the endurance and the strength needed for everyday activities through controlled exercises.  Complete these exercises as instructed by your physician, physical therapist or athletic trainer. Progress the resistance and repetitions only as guided. STRENGTH - Towel Curls  Sit in a chair positioned on a non-carpeted surface.  Place your foot on a towel, keeping your heel on the floor.  Pull the towel toward your heel by only curling your toes. Keep your heel on the floor.  If instructed by your physician, physical therapist or athletic trainer, add ____________________ at the end of the towel. Repeat __________ times. Complete this exercise __________ times per day. STRENGTH - Ankle Inversion  Secure one end of a rubber exercise band/tubing to a fixed object (table, pole). Loop the other end around your foot just before your toes.  Place your fists between your knees. This will focus your strengthening at your ankle.  Slowly, pull your big toe up and in, making sure the band/tubing is positioned to resist the entire motion.  Hold this position for __________ seconds.  Have your muscles resist the band/tubing as it slowly pulls your foot back to the starting position. Repeat __________ times. Complete this exercises __________ times per day.  Document Released: 09/19/2005 Document Revised: 12/12/2011 Document Reviewed: 01/01/2009 [facility] ([facility]) Patient Information 2015 [place], [place]. This information is not intended to replace advice given to you by your health care provider. Make sure you discuss any questions you have with your health care provider.

## 2015-01-30 NOTE — Progress Notes (Signed)
Pre visit review using our clinic review tool, if applicable. No additional management support is needed unless otherwise documented below in the visit note. 

## 2015-02-01 NOTE — Progress Notes (Signed)
   Subjective:    Patient ID: Anthony Harrington, male    DOB: 05/06/1966, 49 y.o.   MRN: [id number]  HPI The patient is a 49 YO man coming in to follow up on his back pain. He has been using his pain medication sparingly and only when needed. He is able to make 60 pills last about 2 months. He does occasionally have to use it twice in one day but not often. Denies fevers, chills, weight loss. Denies numbness, tingling in his legs. 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   Review of Systems  Constitutional: Negative for fever, activity change, appetite change, fatigue and unexpected weight change.  HENT: Negative.   Respiratory: Negative for cough, chest tightness, shortness of breath and wheezing.   Cardiovascular: Negative for chest pain, palpitations and leg swelling.  Gastrointestinal: Negative for abdominal pain, diarrhea, constipation and abdominal distention.  Musculoskeletal: Positive for back pain and arthralgias.  Psychiatric/Behavioral: Negative for suicidal ideas, sleep disturbance, self-injury and decreased concentration.      Objective:   Physical Exam  Constitutional: He is oriented to person, place, and time. He appears well-developed and well-nourished. No distress.  Overweight  HENT:  Head: Normocephalic and atraumatic.  Eyes: EOM are normal.  Neck: Normal range of motion.  Cardiovascular: Normal rate and regular rhythm.   Pulmonary/Chest: Effort normal and breath sounds normal. No respiratory distress. He has no wheezes. He has no rales.  Abdominal: Soft. Bowel sounds are normal.  Neurological: He is alert and oriented to person, place, and time. Coordination normal.  Skin: Skin is warm and dry.   Filed Vitals:   01/30/15 1538  BP: 128/80  Pulse: 101  Temp: 98.3 F (36.8 C)  TempSrc: Oral  Resp: 16  Height: 6' (1.829 m)  Weight: 257 lb 12.8 oz (116.937 kg)  SpO2: 97%      Assessment & Plan:

## 2015-02-01 NOTE — Assessment & Plan Note (Signed)
Refill his pain medication and continue to allow #60 vicodin for his back pain.

## 2015-02-26 ENCOUNTER — Other Ambulatory Visit: Payer: Self-pay | Admitting: Internal Medicine

## 2015-02-26 ENCOUNTER — Telehealth: Payer: Self-pay | Admitting: Internal Medicine

## 2015-02-26 MED ORDER — HYDROCODONE-ACETAMINOPHEN 10-325 MG PO TABS: 1.0000 | ORAL_TABLET | Freq: Two times a day (BID) | ORAL | Status: DC | PRN

## 2015-02-26 NOTE — Telephone Encounter (Signed)
Pt called in said that he needs refill on his HYDROcodone-acetaminophen (NORCO) 10-325 MG per tablet [[id number]][[id number]] .  He knows it isnt due til 5/29 but he he wanted to know if he can pick up paper copy tomorrow before the weekend?

## 2015-02-26 NOTE — Telephone Encounter (Signed)
Printed and signed.  

## 2015-02-27 NOTE — Telephone Encounter (Signed)
Patient aware.

## 2015-03-27 ENCOUNTER — Telehealth: Payer: Self-pay | Admitting: Internal Medicine

## 2015-03-27 MED ORDER — HYDROCODONE-ACETAMINOPHEN 10-325 MG PO TABS: 1.0000 | ORAL_TABLET | Freq: Two times a day (BID) | ORAL | Status: DC | PRN

## 2015-03-27 NOTE — Telephone Encounter (Signed)
Printed and signed, can pick up on Monday

## 2015-03-27 NOTE — Telephone Encounter (Signed)
Patient is requesting med refill on norco for Monday.

## 2015-03-27 NOTE — Telephone Encounter (Signed)
Patient aware.

## 2015-04-06 ENCOUNTER — Other Ambulatory Visit: Payer: Self-pay | Admitting: Internal Medicine

## 2015-04-27 ENCOUNTER — Telehealth: Payer: Self-pay | Admitting: Internal Medicine

## 2015-04-27 MED ORDER — HYDROCODONE-ACETAMINOPHEN 10-325 MG PO TABS: 1.0000 | ORAL_TABLET | Freq: Two times a day (BID) | ORAL | Status: DC | PRN

## 2015-04-27 NOTE — Telephone Encounter (Signed)
Printed and signed.  

## 2015-04-27 NOTE — Telephone Encounter (Signed)
Patient need are refill HYDROcodone-acetaminophen (NORCO) 10-325 MG per tablet.

## 2015-05-18 ENCOUNTER — Other Ambulatory Visit: Payer: Self-pay | Admitting: Internal Medicine

## 2015-05-26 ENCOUNTER — Telehealth: Payer: Self-pay | Admitting: Internal Medicine

## 2015-05-26 NOTE — Telephone Encounter (Signed)
Patient requesting refill for HYDROcodone-acetaminophen (NORCO) 10-325 MG per tablet [[id number]]

## 2015-05-27 MED ORDER — HYDROCODONE-ACETAMINOPHEN 10-325 MG PO TABS: 1.0000 | ORAL_TABLET | Freq: Two times a day (BID) | ORAL | Status: DC | PRN

## 2015-05-27 NOTE — Telephone Encounter (Signed)
Patient will come pick up. 

## 2015-05-27 NOTE — Telephone Encounter (Signed)
Printed and signed.  

## 2015-06-25 ENCOUNTER — Telehealth: Payer: Self-pay | Admitting: *Deleted

## 2015-06-25 ENCOUNTER — Other Ambulatory Visit: Payer: Self-pay | Admitting: Internal Medicine

## 2015-06-25 MED ORDER — HYDROCODONE-ACETAMINOPHEN 10-325 MG PO TABS: 1.0000 | ORAL_TABLET | Freq: Two times a day (BID) | ORAL | Status: DC | PRN

## 2015-06-25 NOTE — Telephone Encounter (Signed)
Left msg on triage requesting refill on his Norco.../lmb

## 2015-06-25 NOTE — Telephone Encounter (Signed)
Notified pt rx ready for pick-up.../lmb 

## 2015-06-25 NOTE — Telephone Encounter (Signed)
Printed and signed, UDS at pickup.

## 2015-06-26 ENCOUNTER — Encounter: Payer: Self-pay | Admitting: Podiatry

## 2015-06-26 ENCOUNTER — Ambulatory Visit (INDEPENDENT_AMBULATORY_CARE_PROVIDER_SITE_OTHER): Payer: Managed Care, Other (non HMO) | Admitting: Podiatry

## 2015-06-26 ENCOUNTER — Ambulatory Visit (INDEPENDENT_AMBULATORY_CARE_PROVIDER_SITE_OTHER): Payer: Managed Care, Other (non HMO)

## 2015-06-26 VITALS — BP 148/105 | HR 105 | Resp 16 | Ht 72.0 in | Wt 256.0 lb

## 2015-06-26 DIAGNOSIS — M79671 Pain in right foot: Secondary | ICD-10-CM

## 2015-06-26 DIAGNOSIS — M722 Plantar fascial fibromatosis: Secondary | ICD-10-CM | POA: Diagnosis not present

## 2015-06-26 DIAGNOSIS — M79672 Pain in left foot: Secondary | ICD-10-CM | POA: Diagnosis not present

## 2015-06-26 MED ORDER — TRIAMCINOLONE ACETONIDE 10 MG/ML IJ SUSP
10.0000 mg | Freq: Once | INTRAMUSCULAR | Status: AC
  Administered 2015-06-26: 10 mg

## 2015-06-26 MED ORDER — DICLOFENAC SODIUM 75 MG PO TBEC: 75.0000 mg | DELAYED_RELEASE_TABLET | Freq: Two times a day (BID) | ORAL | Status: DC

## 2015-06-26 NOTE — Patient Instructions (Signed)

## 2015-06-26 NOTE — Progress Notes (Signed)
   Subjective:    Patient ID: Anthony Harrington, male    DOB: [birth year], 49 y.o.   MRN: [id number]  HPI Patient presents with foot pain in their right foot, lateral side; top of foot and bottom of arch. This has been going on for the past 4-5 months. Pt stated, "has stretched foot and has a boot that has not helped with pain".   Review of Systems  Musculoskeletal: Positive for gait problem.  All other systems reviewed and are negative.      Objective:   Physical Exam        Assessment & Plan:

## 2015-06-28 NOTE — Progress Notes (Signed)
Subjective:     Patient ID: Anthony Harrington, male   DOB: [birth year], 49 y.o.   MRN: [id number]  HPI patient presents with a lot of pain in the right lateral foot and also the bottom of the heel. [place] is been going on for about 6 months and is gradually gotten worse and she's tried a night splint which has not been helpful   Review of Systems  All other systems reviewed and are negative.      Objective:   Physical Exam  Constitutional: He is oriented to person, place, and time.  Cardiovascular: Intact distal pulses.   Musculoskeletal: Normal range of motion.  Neurological: He is oriented to person, place, and time.  Skin: Skin is warm.  Nursing note and vitals reviewed.  neurovascular status intact muscle strength adequate range of motion within normal limits with patient noted to have discomfort in the plantar aspect of the right heel at the insertion of the tendon calcaneus medial side and moderate discomfort in the lateral side of the foot that appears to be compensatory in nature. Patient has good digital perfusion and is well oriented 3     Assessment:     Inflammatory tendinitis right foot with probable plantar fasciitis    Plan:     H&P and conditions reviewed and today I injected the right plantar fascia 3 mg Kenalog 5 mg Xylocaine placed on diclofenac and dispensed fascial brace with instructions on usage. Instructed on physical therapy and reappoint to recheck in the next several weeks

## 2015-07-09 ENCOUNTER — Ambulatory Visit (INDEPENDENT_AMBULATORY_CARE_PROVIDER_SITE_OTHER): Payer: Managed Care, Other (non HMO) | Admitting: Podiatry

## 2015-07-09 ENCOUNTER — Encounter: Payer: Self-pay | Admitting: Podiatry

## 2015-07-09 VITALS — BP 126/55 | HR 56 | Resp 12

## 2015-07-09 DIAGNOSIS — M779 Enthesopathy, unspecified: Secondary | ICD-10-CM | POA: Diagnosis not present

## 2015-07-09 MED ORDER — TRIAMCINOLONE ACETONIDE 10 MG/ML IJ SUSP
10.0000 mg | Freq: Once | INTRAMUSCULAR | Status: AC
  Administered 2015-07-09: 10 mg

## 2015-07-10 ENCOUNTER — Ambulatory Visit: Payer: Managed Care, Other (non HMO) | Admitting: Podiatry

## 2015-07-11 NOTE — Progress Notes (Signed)
Subjective:     Patient ID: Anthony Harrington, male   DOB: 05/01/1966, 49 y.o.   MRN: [id number]  HPI patient presents that the bottom of my foot is doing a lot better but I'm having a lot of pain on top and it's making it difficult for me to walk comfortably   Review of Systems     Objective:   Physical Exam Neurovascular status unchanged with significant diminishment of discomfort plantar aspect right heel with quite a bit of discomfort in the dorsum of the right foot around the extensor tendon complex with no indications of current bony pathology    Assessment:     Improving plantar fasciitis with probable dorsal tendinitis secondary to gait change     Plan:     Reviewed continued physical therapy for the plantar heel and at this time I injected the dorsal tendon complex 3 mg Kenalog 5 mg Xylocaine and instructed on physical therapy

## 2015-07-19 ENCOUNTER — Telehealth: Payer: Self-pay | Admitting: Internal Medicine

## 2015-07-20 MED ORDER — HYDROCODONE-ACETAMINOPHEN 10-325 MG PO TABS: 1.0000 | ORAL_TABLET | Freq: Two times a day (BID) | ORAL | Status: DC | PRN

## 2015-07-23 ENCOUNTER — Encounter: Payer: Self-pay | Admitting: Internal Medicine

## 2015-08-18 ENCOUNTER — Telehealth: Payer: Self-pay | Admitting: *Deleted

## 2015-08-18 MED ORDER — HYDROCODONE-ACETAMINOPHEN 10-325 MG PO TABS: 1.0000 | ORAL_TABLET | Freq: Two times a day (BID) | ORAL | Status: DC | PRN

## 2015-08-18 NOTE — Telephone Encounter (Signed)
Notified pt rx ready for pick-up.../lmb 

## 2015-08-18 NOTE — Telephone Encounter (Signed)
Left msg on triage pt is requesting refill on his hydrocodone...Raechel Chute/lmb

## 2015-08-23 ENCOUNTER — Other Ambulatory Visit: Payer: Self-pay | Admitting: Internal Medicine

## 2015-09-15 ENCOUNTER — Telehealth: Payer: Self-pay | Admitting: *Deleted

## 2015-09-15 MED ORDER — HYDROCODONE-ACETAMINOPHEN 10-325 MG PO TABS: 1.0000 | ORAL_TABLET | Freq: Two times a day (BID) | ORAL | Status: DC | PRN

## 2015-09-15 NOTE — Telephone Encounter (Signed)
Called pt back inform him md printed for this month, but will need appt for next refill...Raechel Chute/lmb

## 2015-09-15 NOTE — Telephone Encounter (Signed)
He is overdue for visit and this refill will be last until visit.

## 2015-09-15 NOTE — Telephone Encounter (Signed)
Notified pt with md response. Made appt for 09/18/15 @ 3:15...Raechel Chute/lmb

## 2015-09-15 NOTE — Telephone Encounter (Signed)
Left msg on triage requesting refill on his hydrocodone.../lmb 

## 2015-09-18 ENCOUNTER — Ambulatory Visit: Payer: Managed Care, Other (non HMO) | Admitting: Internal Medicine

## 2015-09-22 ENCOUNTER — Other Ambulatory Visit: Payer: Self-pay | Admitting: Internal Medicine

## 2015-10-14 ENCOUNTER — Encounter: Payer: Self-pay | Admitting: Internal Medicine

## 2015-10-14 ENCOUNTER — Other Ambulatory Visit (INDEPENDENT_AMBULATORY_CARE_PROVIDER_SITE_OTHER): Payer: Managed Care, Other (non HMO)

## 2015-10-14 ENCOUNTER — Ambulatory Visit (INDEPENDENT_AMBULATORY_CARE_PROVIDER_SITE_OTHER): Payer: Managed Care, Other (non HMO) | Admitting: Internal Medicine

## 2015-10-14 VITALS — BP 170/100 | HR 79 | Temp 98.6°F | Resp 18 | Ht 72.0 in | Wt 271.4 lb

## 2015-10-14 DIAGNOSIS — M5136 Other intervertebral disc degeneration, lumbar region: Secondary | ICD-10-CM

## 2015-10-14 DIAGNOSIS — F17200 Nicotine dependence, unspecified, uncomplicated: Secondary | ICD-10-CM

## 2015-10-14 DIAGNOSIS — I10 Essential (primary) hypertension: Secondary | ICD-10-CM

## 2015-10-14 LAB — LIPID PANEL
CHOLESTEROL: 246 mg/dL — AB (ref 0–200)
HDL: 67.5 mg/dL (ref >39.00)
LDL Cholesterol: 154 mg/dL — ABNORMAL HIGH (ref 0–99)
NonHDL: 178.43
TRIGLYCERIDES: 123 mg/dL (ref 0.0–149.0)
Total CHOL/HDL Ratio: 4
VLDL: 24.6 mg/dL (ref 0.0–40.0)

## 2015-10-14 LAB — COMPREHENSIVE METABOLIC PANEL
ALK PHOS: 70 U/L (ref 39–117)
ALT: 44 U/L (ref 0–53)
AST: 27 U/L (ref 0–37)
Albumin: 4.9 g/dL (ref 3.5–5.2)
BILIRUBIN TOTAL: 0.6 mg/dL (ref 0.2–1.2)
BUN: 14 mg/dL (ref 6–23)
CALCIUM: 10.4 mg/dL (ref 8.4–10.5)
CO2: 27 mEq/L (ref 19–32)
Chloride: 100 mEq/L (ref 96–112)
Creatinine, Ser: 0.77 mg/dL (ref 0.40–1.50)
GFR: 114.45 mL/min (ref >60.00)
Glucose, Bld: 127 mg/dL — ABNORMAL HIGH (ref 70–99)
Potassium: 4.2 mEq/L (ref 3.5–5.1)
Sodium: 137 mEq/L (ref 135–145)
TOTAL PROTEIN: 7.5 g/dL (ref 6.0–8.3)

## 2015-10-14 LAB — HEMOGLOBIN A1C: Hgb A1c MFr Bld: 5.9 % (ref 4.6–6.5)

## 2015-10-14 MED ORDER — HYDROCODONE-ACETAMINOPHEN 10-325 MG PO TABS: 1.0000 | ORAL_TABLET | Freq: Two times a day (BID) | ORAL | Status: DC | PRN

## 2015-10-14 NOTE — Progress Notes (Signed)
Pre visit review using our clinic review tool, if applicable. No additional management support is needed unless otherwise documented below in the visit note. 

## 2015-10-14 NOTE — Patient Instructions (Signed)
We are giving you the refill today and checking the labs. We will call you back with the results.   Come back in 3 months so we can check on the blood pressure.   Hypertension Hypertension, commonly called high blood pressure, is when the force of blood pumping through your arteries is too strong. Your arteries are the blood vessels that carry blood from your heart throughout your body. A blood pressure reading consists of a higher number over a lower number, such as 110/72. The higher number (systolic) is the pressure inside your arteries when your heart pumps. The lower number (diastolic) is the pressure inside your arteries when your heart relaxes. Ideally you want your blood pressure below 120/80. Hypertension forces your heart to work harder to pump blood. Your arteries may become narrow or stiff. Having untreated or uncontrolled hypertension can cause heart attack, stroke, kidney disease, and other problems. RISK FACTORS Some risk factors for high blood pressure are controllable. Others are not.  Risk factors you cannot control include:   Race. You may be at higher risk if you are African American.  Age. Risk increases with age.  Gender. Men are at higher risk than women before age 49 years. After age 49, women are at higher risk than men. Risk factors you can control include:  Not getting enough exercise or physical activity.  Being overweight.  Getting too much fat, sugar, calories, or salt in your diet.  Drinking too much alcohol. SIGNS AND SYMPTOMS Hypertension does not usually cause signs or symptoms. Extremely high blood pressure (hypertensive crisis) may cause headache, anxiety, shortness of breath, and nosebleed. DIAGNOSIS To check if you have hypertension, your health care provider will measure your blood pressure while you are seated, with your arm held at the level of your heart. It should be measured at least twice using the same arm. Certain conditions can cause a  difference in blood pressure between your right and left arms. A blood pressure reading that is higher than normal on one occasion does not mean that you need treatment. If it is not clear whether you have high blood pressure, you may be asked to return on a different day to have your blood pressure checked again. Or, you may be asked to monitor your blood pressure at home for 1 or more weeks. TREATMENT Treating high blood pressure includes making lifestyle changes and possibly taking medicine. Living a healthy lifestyle can help lower high blood pressure. You may need to change some of your habits. Lifestyle changes may include:  Following the DASH diet. This diet is high in fruits, vegetables, and whole grains. It is low in salt, red meat, and added sugars.  Keep your sodium intake below 2,300 mg per day.  Getting at least 30-45 minutes of aerobic exercise at least 4 times per week.  Losing weight if necessary.  Not smoking.  Limiting alcoholic beverages.  Learning ways to reduce stress. Your health care provider may prescribe medicine if lifestyle changes are not enough to get your blood pressure under control, and if one of the following is true:  You are 2918-49 years of age and your systolic blood pressure is above 140.  You are 49 years of age or older, and your systolic blood pressure is above 150.  Your diastolic blood pressure is above 90.  You have diabetes, and your systolic blood pressure is over 140 or your diastolic blood pressure is over 90.  You have kidney disease and your blood  pressure is above 140/90.  You have heart disease and your blood pressure is above 140/90. Your personal target blood pressure may vary depending on your medical conditions, your age, and other factors. HOME CARE INSTRUCTIONS  Have your blood pressure rechecked as directed by your health care provider.   Take medicines only as directed by your health care provider. Follow the directions  carefully. Blood pressure medicines must be taken as prescribed. The medicine does not work as well when you skip doses. Skipping doses also puts you at risk for problems.  Do not smoke.   Monitor your blood pressure at home as directed by your health care provider. SEEK MEDICAL CARE IF:   You think you are having a reaction to medicines taken.  You have recurrent headaches or feel dizzy.  You have swelling in your ankles.  You have trouble with your vision. SEEK IMMEDIATE MEDICAL CARE IF:  You develop a severe headache or confusion.  You have unusual weakness, numbness, or feel faint.  You have severe chest or abdominal pain.  You vomit repeatedly.  You have trouble breathing. MAKE SURE YOU:   Understand these instructions.  Will watch your condition.  Will get help right away if you are not doing well or get worse.   This information is not intended to replace advice given to you by your health care provider. Make sure you discuss any questions you have with your health care provider.   Document Released: 09/19/2005 Document Revised: 02/03/2015 Document Reviewed: 07/12/2013 Elsevier Interactive Patient Education [employer].

## 2015-10-16 NOTE — Assessment & Plan Note (Signed)
Refilled his hydrocodone and needs UDS with next pickup since last negative for hydrocodone. If recurrent negative will not be able to prescribe. He does fill every month on time.

## 2015-10-16 NOTE — Assessment & Plan Note (Signed)
Counseled to quit and he does not feel able at this time due to lots of people smoking at his work.

## 2015-10-16 NOTE — Progress Notes (Signed)
   Subjective:    Patient ID: Anthony RodneyDanny R Dillon, male    DOB: 1965/12/17, 50 y.o.   MRN: [id number]  HPI The patient is a 50 YO man coming in for follow up of his chronic back pain. He has fallen in the ice recently and hurt his back. He did take extra pain medication and has run out early (several days prior to visit). He generally takes as prescribed and they help him to be functional and do his job. They do not impair his concentration or make him drowsy. He does not take within 30 minutes of driving.   Review of Systems  Constitutional: Negative for fever, activity change, appetite change, fatigue and unexpected weight change.  HENT: Negative.   Respiratory: Negative for cough, chest tightness, shortness of breath and wheezing.   Cardiovascular: Negative for chest pain, palpitations and leg swelling.  Gastrointestinal: Negative for abdominal pain, diarrhea, constipation and abdominal distention.  Musculoskeletal: Positive for back pain and arthralgias.  Skin: Negative.   Neurological: Negative.   Psychiatric/Behavioral: Negative for suicidal ideas, sleep disturbance, self-injury and decreased concentration.      Objective:   Physical Exam  Constitutional: He is oriented to person, place, and time. He appears well-developed and well-nourished. No distress.  Overweight  HENT:  Head: Normocephalic and atraumatic.  Eyes: EOM are normal.  Neck: Normal range of motion.  Cardiovascular: Normal rate and regular rhythm.   Pulmonary/Chest: Effort normal and breath sounds normal. No respiratory distress. He has no wheezes. He has no rales.  Abdominal: Soft. Bowel sounds are normal.  Musculoskeletal: He exhibits no edema.  Neurological: He is alert and oriented to person, place, and time. Coordination normal.  Skin: Skin is warm and dry.   Filed Vitals:   10/14/15 1103  BP: 170/100  Pulse: 79  Temp: 98.6 F (37 C)  TempSrc: Oral  Resp: 18  Height: 6' (1.829 m)  Weight: 271 lb 6.4 oz  (123.106 kg)  SpO2: 98%      Assessment & Plan:

## 2015-10-20 ENCOUNTER — Encounter: Payer: Self-pay | Admitting: Internal Medicine

## 2015-11-02 ENCOUNTER — Other Ambulatory Visit: Payer: Self-pay | Admitting: Internal Medicine

## 2015-11-12 ENCOUNTER — Telehealth: Payer: Self-pay | Admitting: Internal Medicine

## 2015-11-12 MED ORDER — HYDROCODONE-ACETAMINOPHEN 10-325 MG PO TABS: 1.0000 | ORAL_TABLET | Freq: Two times a day (BID) | ORAL | Status: DC | PRN

## 2015-11-12 NOTE — Telephone Encounter (Signed)
Left message informing patient that the rx is ready and he will have to come pick it up himself. Placed in front cabinet. Labeled needs UDS.

## 2015-11-12 NOTE — Telephone Encounter (Signed)
Printed and signed, needs UDS.  

## 2015-11-12 NOTE — Telephone Encounter (Signed)
Pt requesting refill for HYDROcodone-acetaminophen (NORCO) 10-325 MG tablet [[id number]]  Please call [phone]

## 2015-11-23 ENCOUNTER — Other Ambulatory Visit: Payer: Self-pay | Admitting: Internal Medicine

## 2015-12-09 ENCOUNTER — Other Ambulatory Visit: Payer: Self-pay | Admitting: Internal Medicine

## 2015-12-09 ENCOUNTER — Other Ambulatory Visit: Payer: Self-pay

## 2015-12-09 NOTE — Telephone Encounter (Signed)
Pt left message on traige requesting a refill of Norco. Please advise

## 2015-12-10 NOTE — Telephone Encounter (Signed)
Denied as last urine drug screen was inappropriate and told at his last visit if another inappropriate we would be able to continue. Can schedule visit to discuss.

## 2015-12-10 NOTE — Telephone Encounter (Signed)
LVM for pt to call back as soon as possible.   RE: PCP note below. (I will speak to pt if he calls back)

## 2015-12-10 NOTE — Telephone Encounter (Signed)
There is another note regarding the same and with PCP advise.

## 2016-01-12 ENCOUNTER — Ambulatory Visit: Payer: Managed Care, Other (non HMO) | Admitting: Internal Medicine

## 2016-03-14 ENCOUNTER — Other Ambulatory Visit: Payer: Self-pay | Admitting: Internal Medicine

## 2016-06-08 ENCOUNTER — Other Ambulatory Visit: Payer: Self-pay | Admitting: Internal Medicine

## 2016-12-01 ENCOUNTER — Other Ambulatory Visit: Payer: Self-pay | Admitting: Internal Medicine

## 2017-01-12 ENCOUNTER — Other Ambulatory Visit: Payer: Self-pay | Admitting: Internal Medicine

## 2017-09-03 ENCOUNTER — Other Ambulatory Visit: Payer: Self-pay | Admitting: Internal Medicine

## 2017-10-25 ENCOUNTER — Telehealth: Payer: Self-pay | Admitting: Internal Medicine

## 2017-10-25 NOTE — Telephone Encounter (Signed)
Copied from CRM [id number]. Topic: General - Other >> Oct 25, 2017  9:34 AM Lelon FrohlichGolden, Jazzmyn Filion, RMA wrote: Reason for CRM: pt called and requested refill for hyzaar 100-25 mg but I informed pt that he has not been seen in over a year and pt stated that he could not afford his co-pay  Please contact pt and advice [phone]

## 2017-10-25 NOTE — Telephone Encounter (Signed)
Patient has not been in for over a year, but if he makes an appointment for 30-90 days out would you be willing to refill his medication till that appointment? States that he cannot afford his co-pay

## 2017-10-26 NOTE — Telephone Encounter (Signed)
Does he have insurance? Usually scheduling a physical has no copay. Has not been seen in more than 2 years so would need to come in.

## 2017-10-26 NOTE — Telephone Encounter (Signed)
LVM for patient to call back to give MD response. Would not be able to send any medication in until an appointment is scheduled for a physical, and if he has insurance a physical usually has no co-pay

## 2017-11-06 ENCOUNTER — Other Ambulatory Visit: Payer: Self-pay | Admitting: Internal Medicine

## 2019-08-04 DIAGNOSIS — I639 Cerebral infarction, unspecified: Secondary | ICD-10-CM

## 2019-08-04 HISTORY — DX: Cerebral infarction, unspecified: I63.9

## 2019-08-23 ENCOUNTER — Encounter (HOSPITAL_COMMUNITY): Payer: Self-pay | Admitting: Internal Medicine

## 2019-08-23 ENCOUNTER — Inpatient Hospital Stay (HOSPITAL_COMMUNITY): Payer: 59

## 2019-08-23 ENCOUNTER — Inpatient Hospital Stay (HOSPITAL_COMMUNITY)
Admission: EM | Admit: 2019-08-23 | Discharge: 2019-08-25 | DRG: 066 | Disposition: A | Discharge status: Home / Self Care | Payer: 59 | Attending: Internal Medicine | Admitting: Internal Medicine

## 2019-08-23 ENCOUNTER — Other Ambulatory Visit: Payer: Self-pay

## 2019-08-23 ENCOUNTER — Emergency Department (HOSPITAL_COMMUNITY): Payer: 59

## 2019-08-23 ENCOUNTER — Other Ambulatory Visit (HOSPITAL_COMMUNITY): Payer: 59

## 2019-08-23 DIAGNOSIS — E785 Hyperlipidemia, unspecified: Secondary | ICD-10-CM | POA: Diagnosis present

## 2019-08-23 DIAGNOSIS — R4781 Slurred speech: Secondary | ICD-10-CM | POA: Diagnosis present

## 2019-08-23 DIAGNOSIS — R7303 Prediabetes: Secondary | ICD-10-CM | POA: Diagnosis present

## 2019-08-23 DIAGNOSIS — Z8249 Family history of ischemic heart disease and other diseases of the circulatory system: Secondary | ICD-10-CM

## 2019-08-23 DIAGNOSIS — Z9119 Patient's noncompliance with other medical treatment and regimen: Secondary | ICD-10-CM

## 2019-08-23 DIAGNOSIS — Z888 Allergy status to other drugs, medicaments and biological substances status: Secondary | ICD-10-CM | POA: Diagnosis not present

## 2019-08-23 DIAGNOSIS — I10 Essential (primary) hypertension: Secondary | ICD-10-CM | POA: Diagnosis present

## 2019-08-23 DIAGNOSIS — Z6836 Body mass index (BMI) 36.0-36.9, adult: Secondary | ICD-10-CM | POA: Diagnosis not present

## 2019-08-23 DIAGNOSIS — Z8349 Family history of other endocrine, nutritional and metabolic diseases: Secondary | ICD-10-CM | POA: Diagnosis not present

## 2019-08-23 DIAGNOSIS — F17200 Nicotine dependence, unspecified, uncomplicated: Secondary | ICD-10-CM | POA: Diagnosis present

## 2019-08-23 DIAGNOSIS — I639 Cerebral infarction, unspecified: Secondary | ICD-10-CM | POA: Diagnosis not present

## 2019-08-23 DIAGNOSIS — Z79899 Other long term (current) drug therapy: Secondary | ICD-10-CM

## 2019-08-23 DIAGNOSIS — R471 Dysarthria and anarthria: Secondary | ICD-10-CM | POA: Diagnosis present

## 2019-08-23 DIAGNOSIS — Z91128 Patient's intentional underdosing of medication regimen for other reason: Secondary | ICD-10-CM

## 2019-08-23 DIAGNOSIS — Z20828 Contact with and (suspected) exposure to other viral communicable diseases: Secondary | ICD-10-CM | POA: Diagnosis present

## 2019-08-23 DIAGNOSIS — Z833 Family history of diabetes mellitus: Secondary | ICD-10-CM | POA: Diagnosis not present

## 2019-08-23 DIAGNOSIS — R29810 Facial weakness: Secondary | ICD-10-CM | POA: Diagnosis present

## 2019-08-23 DIAGNOSIS — E669 Obesity, unspecified: Secondary | ICD-10-CM | POA: Diagnosis present

## 2019-08-23 DIAGNOSIS — I6389 Other cerebral infarction: Secondary | ICD-10-CM

## 2019-08-23 DIAGNOSIS — G40909 Epilepsy, unspecified, not intractable, without status epilepticus: Secondary | ICD-10-CM | POA: Diagnosis present

## 2019-08-23 DIAGNOSIS — Z7982 Long term (current) use of aspirin: Secondary | ICD-10-CM

## 2019-08-23 DIAGNOSIS — Y92009 Unspecified place in unspecified non-institutional (private) residence as the place of occurrence of the external cause: Secondary | ICD-10-CM

## 2019-08-23 DIAGNOSIS — F1020 Alcohol dependence, uncomplicated: Secondary | ICD-10-CM | POA: Diagnosis present

## 2019-08-23 DIAGNOSIS — Z823 Family history of stroke: Secondary | ICD-10-CM | POA: Diagnosis not present

## 2019-08-23 DIAGNOSIS — Z8673 Personal history of transient ischemic attack (TIA), and cerebral infarction without residual deficits: Secondary | ICD-10-CM | POA: Diagnosis present

## 2019-08-23 DIAGNOSIS — F17210 Nicotine dependence, cigarettes, uncomplicated: Secondary | ICD-10-CM | POA: Diagnosis present

## 2019-08-23 DIAGNOSIS — R29703 NIHSS score 3: Secondary | ICD-10-CM | POA: Diagnosis present

## 2019-08-23 DIAGNOSIS — T465X6A Underdosing of other antihypertensive drugs, initial encounter: Secondary | ICD-10-CM | POA: Diagnosis present

## 2019-08-23 LAB — CBC
HCT: 50 % (ref 39.0–52.0)
Hemoglobin: 17.1 g/dL — ABNORMAL HIGH (ref 13.0–17.0)
MCH: 34 pg (ref 26.0–34.0)
MCHC: 34.2 g/dL (ref 30.0–36.0)
MCV: 99.4 fL (ref 80.0–100.0)
Platelets: 218 10*3/uL (ref 150–400)
RBC: 5.03 MIL/uL (ref 4.22–5.81)
RDW: 11.9 % (ref 11.5–15.5)
WBC: 8.4 10*3/uL (ref 4.0–10.5)
nRBC: 0 % (ref 0.0–0.2)

## 2019-08-23 LAB — ECHOCARDIOGRAM COMPLETE

## 2019-08-23 LAB — CK: Total CK: 143 U/L (ref 49–397)

## 2019-08-23 LAB — I-STAT CHEM 8, ED
BUN: 26 mg/dL — ABNORMAL HIGH (ref 6–20)
Calcium, Ion: 1.2 mmol/L (ref 1.15–1.40)
Chloride: 104 mmol/L (ref 98–111)
Creatinine, Ser: 0.7 mg/dL (ref 0.61–1.24)
Glucose, Bld: 149 mg/dL — ABNORMAL HIGH (ref 70–99)
HCT: 52 % (ref 39.0–52.0)
Hemoglobin: 17.7 g/dL — ABNORMAL HIGH (ref 13.0–17.0)
Potassium: 3.8 mmol/L (ref 3.5–5.1)
Sodium: 138 mmol/L (ref 135–145)
TCO2: 22 mmol/L (ref 22–32)

## 2019-08-23 LAB — DIFFERENTIAL
Abs Immature Granulocytes: 0.03 10*3/uL (ref 0.00–0.07)
Basophils Absolute: 0.1 10*3/uL (ref 0.0–0.1)
Basophils Relative: 1 %
Eosinophils Absolute: 0.1 10*3/uL (ref 0.0–0.5)
Eosinophils Relative: 1 %
Immature Granulocytes: 0 %
Lymphocytes Relative: 23 %
Lymphs Abs: 2 10*3/uL (ref 0.7–4.0)
Monocytes Absolute: 0.7 10*3/uL (ref 0.1–1.0)
Monocytes Relative: 8 %
Neutro Abs: 5.6 10*3/uL (ref 1.7–7.7)
Neutrophils Relative %: 67 %

## 2019-08-23 LAB — COMPREHENSIVE METABOLIC PANEL
ALT: 69 U/L — ABNORMAL HIGH (ref 0–44)
AST: 46 U/L — ABNORMAL HIGH (ref 15–41)
Albumin: 4.8 g/dL (ref 3.5–5.0)
Alkaline Phosphatase: 75 U/L (ref 38–126)
Anion gap: 15 (ref 5–15)
BUN: 22 mg/dL — ABNORMAL HIGH (ref 6–20)
CO2: 20 mmol/L — ABNORMAL LOW (ref 22–32)
Calcium: 10 mg/dL (ref 8.9–10.3)
Chloride: 104 mmol/L (ref 98–111)
Creatinine, Ser: 0.79 mg/dL (ref 0.61–1.24)
GFR calc Af Amer: >60 mL/min (ref >60)
GFR calc non Af Amer: >60 mL/min (ref >60)
Glucose, Bld: 148 mg/dL — ABNORMAL HIGH (ref 70–99)
Potassium: 3.9 mmol/L (ref 3.5–5.1)
Sodium: 139 mmol/L (ref 135–145)
Total Bilirubin: 1.1 mg/dL (ref 0.3–1.2)
Total Protein: 7.6 g/dL (ref 6.5–8.1)

## 2019-08-23 LAB — PROTIME-INR
INR: 1 (ref 0.8–1.2)
Prothrombin Time: 12.7 seconds (ref 11.4–15.2)

## 2019-08-23 LAB — HIV ANTIBODY (ROUTINE TESTING W REFLEX): HIV Screen 4th Generation wRfx: NONREACTIVE

## 2019-08-23 LAB — TSH: TSH: 1.492 u[IU]/mL (ref 0.350–4.500)

## 2019-08-23 LAB — SARS CORONAVIRUS 2 (TAT 6-24 HRS): SARS Coronavirus 2: NEGATIVE

## 2019-08-23 LAB — APTT: aPTT: 27 seconds (ref 24–36)

## 2019-08-23 MED ORDER — SODIUM CHLORIDE 0.9 % IV SOLN
INTRAVENOUS | Status: DC
  Administered 2019-08-23 (×2): via INTRAVENOUS

## 2019-08-23 MED ORDER — HYDROCODONE-ACETAMINOPHEN 10-325 MG PO TABS: 1.0000 | ORAL_TABLET | Freq: Two times a day (BID) | ORAL | Status: DC | PRN

## 2019-08-23 MED ORDER — HYDROCHLOROTHIAZIDE 25 MG PO TABS
25.0000 mg | ORAL_TABLET | Freq: Every day | ORAL | Status: DC
  Administered 2019-08-23 – 2019-08-25 (×3): 25 mg via ORAL
  Filled 2019-08-23 (×3): qty 1

## 2019-08-23 MED ORDER — STROKE: EARLY STAGES OF RECOVERY BOOK
Freq: Once | Status: AC
  Administered 2019-08-23: 16:00:00
  Filled 2019-08-23: qty 1

## 2019-08-23 MED ORDER — ASPIRIN 325 MG PO TABS
325.0000 mg | ORAL_TABLET | Freq: Every day | ORAL | Status: DC
  Administered 2019-08-23 – 2019-08-25 (×3): 325 mg via ORAL
  Filled 2019-08-23 (×3): qty 1

## 2019-08-23 MED ORDER — LOSARTAN POTASSIUM-HCTZ 100-25 MG PO TABS: 1.0000 | ORAL_TABLET | Freq: Every day | ORAL | Status: DC

## 2019-08-23 MED ORDER — FOLIC ACID 1 MG PO TABS
1.0000 mg | ORAL_TABLET | Freq: Every day | ORAL | Status: DC
  Administered 2019-08-23 – 2019-08-25 (×3): 1 mg via ORAL
  Filled 2019-08-23 (×3): qty 1

## 2019-08-23 MED ORDER — ASPIRIN 300 MG RE SUPP: 300.0000 mg | Freq: Every day | RECTAL | Status: DC

## 2019-08-23 MED ORDER — LOSARTAN POTASSIUM 50 MG PO TABS
100.0000 mg | ORAL_TABLET | Freq: Every day | ORAL | Status: DC
  Administered 2019-08-23 – 2019-08-25 (×3): 100 mg via ORAL
  Filled 2019-08-23 (×3): qty 2

## 2019-08-23 MED ORDER — METOPROLOL TARTRATE 25 MG PO TABS
25.0000 mg | ORAL_TABLET | Freq: Two times a day (BID) | ORAL | Status: DC
  Administered 2019-08-23 – 2019-08-25 (×5): 25 mg via ORAL
  Filled 2019-08-23 (×5): qty 1

## 2019-08-23 MED ORDER — FLUTICASONE PROPIONATE 50 MCG/ACT NA SUSP: 2.0000 | Freq: Every day | NASAL | Status: DC

## 2019-08-23 MED ORDER — SENNOSIDES-DOCUSATE SODIUM 8.6-50 MG PO TABS: 1.0000 | ORAL_TABLET | Freq: Every evening | ORAL | Status: DC | PRN

## 2019-08-23 MED ORDER — ADULT MULTIVITAMIN W/MINERALS CH
1.0000 | ORAL_TABLET | Freq: Every day | ORAL | Status: DC
  Administered 2019-08-23 – 2019-08-25 (×3): 1 via ORAL
  Filled 2019-08-23 (×3): qty 1

## 2019-08-23 MED ORDER — LORAZEPAM 1 MG PO TABS: 1.0000 mg | ORAL_TABLET | ORAL | Status: DC | PRN

## 2019-08-23 MED ORDER — THIAMINE HCL 100 MG/ML IJ SOLN: 100.0000 mg | Freq: Every day | INTRAMUSCULAR | Status: DC

## 2019-08-23 MED ORDER — ACETAMINOPHEN 650 MG RE SUPP: 650.0000 mg | RECTAL | Status: DC | PRN

## 2019-08-23 MED ORDER — LORAZEPAM 2 MG/ML IJ SOLN: 1.0000 mg | INTRAMUSCULAR | Status: DC | PRN

## 2019-08-23 MED ORDER — ACETAMINOPHEN 325 MG PO TABS: 650.0000 mg | ORAL_TABLET | ORAL | Status: DC | PRN

## 2019-08-23 MED ORDER — ATORVASTATIN CALCIUM 40 MG PO TABS
40.0000 mg | ORAL_TABLET | Freq: Every day | ORAL | Status: DC
  Administered 2019-08-23 – 2019-08-24 (×2): 40 mg via ORAL
  Filled 2019-08-23 (×2): qty 1

## 2019-08-23 MED ORDER — ACETAMINOPHEN 160 MG/5ML PO SOLN: 650.0000 mg | ORAL | Status: DC | PRN

## 2019-08-23 MED ORDER — ENOXAPARIN SODIUM 40 MG/0.4ML ~~LOC~~ SOLN
40.0000 mg | SUBCUTANEOUS | Status: DC
  Administered 2019-08-23 – 2019-08-24 (×2): 40 mg via SUBCUTANEOUS
  Filled 2019-08-23 (×2): qty 0.4

## 2019-08-23 MED ORDER — NICOTINE 21 MG/24HR TD PT24: 21.0000 mg | MEDICATED_PATCH | Freq: Every day | TRANSDERMAL | Status: DC | PRN

## 2019-08-23 MED ORDER — VITAMIN B-1 100 MG PO TABS
100.0000 mg | ORAL_TABLET | Freq: Every day | ORAL | Status: DC
  Administered 2019-08-23 – 2019-08-25 (×3): 100 mg via ORAL
  Filled 2019-08-23 (×3): qty 1

## 2019-08-23 MED ORDER — SODIUM CHLORIDE 0.9% FLUSH
3.0000 mL | Freq: Once | INTRAVENOUS | Status: AC
  Administered 2019-08-23: 3 mL via INTRAVENOUS

## 2019-08-23 MED ORDER — PERFLUTREN LIPID MICROSPHERE
1.0000 mL | INTRAVENOUS | Status: AC | PRN
  Administered 2019-08-23: 5 mL via INTRAVENOUS
  Filled 2019-08-23: qty 10

## 2019-08-23 NOTE — ED Provider Notes (Signed)
[facility] EMERGENCY DEPARTMENT Provider Note   CSN: [id number] Arrival date & time: 08/23/19  1005     History   Chief Complaint Chief Complaint  Patient presents with   Cerebrovascular Accident    HPI Anthony Harrington is a 53 y.o. male.     HPI   52 year old male with a history of hypertension, hyperlipidemia, smoking, seizure disorder presents with concern for right-sided facial droop and difficulty speaking.  Reports that symptoms began around 7 PM on Tuesday night, and have not changed.  Denies other numbness, extremity numbness or weakness, changes in vision, headaches, tinnitus, cough, fever or other concerns.  Does have a history of coronary artery disease and CVA in his immediate family.  Reports he has not been compliant with his blood pressure medication.  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   Hyperlipidemia    Hypertension    Seizures (HCC)    x 1 in his teen years    Patient Active Problem List   Diagnosis Date Noted   CVA (cerebral vascular accident) (HCC) 08/23/2019   Class 2 obesity with body mass index (BMI) of 36.0 to 36.9 in adult 08/23/2019   EtOH dependence (HCC) 08/23/2019   Mood disorder (HCC) 07/28/2014   Hypercalcemia 03/15/2014   DDD (degenerative disc disease), lumbar 12/27/2012   Routine health maintenance 02/12/2012   CIGARETTE SMOKER 05/18/2010   Dyslipidemia 10/12/2007   Essential hypertension 10/12/2007   HERNIATED CERVICAL DISC 10/12/2007   Seizure disorder (HCC) 10/12/2007    Past Surgical History:  Procedure Laterality Date   FRACTURE SURGERY     left hip   lumbar spine surgery  2003   microdiskectomy L5-S1 ([place])        Home Medications    Prior to Admission medications   Medication Sig Start Date End Date Taking? Authorizing Provider  fluticasone (FLONASE) 50 MCG/ACT nasal  spray Place 2 sprays into both nostrils daily. Patient not taking: Reported on 08/23/2019 01/30/15   Myrlene Broker, MD  HYDROcodone-acetaminophen [facility]) 10-325 MG tablet Take 1 tablet by mouth 2 (two) times daily as needed for moderate pain. Patient not taking: Reported on 08/23/2019 11/12/15   Myrlene Broker, MD  losartan-hydrochlorothiazide (HYZAAR) 100-25 MG tablet Take 1 tablet by mouth daily. NEEDS ANNUAL VISIT FOR FURTHER REFILLS Patient not taking: Reported on 08/23/2019 09/04/17   Myrlene Broker, MD  metoprolol tartrate (LOPRESSOR) 25 MG tablet TAKE ONE TABLET BY MOUTH TWICE DAILY Patient not taking: Reported on 08/23/2019 01/13/17   Myrlene Broker, MD    Family History Family History  Problem Relation Age of Onset   Hypertension Mother    Diabetes Father    Heart disease Father    Hyperlipidemia Father    Stroke Father    Diabetes Brother    Heart disease Brother 59   COPD Neg Hx    Cancer Neg Hx     Social History Social History   Tobacco Use   Smoking status: Current Every Day Smoker    Packs/day: 2.00    Years: 36.00    Pack years: 72.00    Types: Cigarettes   Smokeless tobacco: Never Used  Substance Use Topics   Alcohol use: Yes    Alcohol/week: 14.0 standard drinks    Types: 14 Standard drinks or equivalent per week    Comment: 48 oz daily; does  have a problem with ETOH, last drink last night   Drug use: No     Allergies   Lisinopril   Review of Systems Review of Systems  Constitutional: Negative for fever.  HENT: Negative for sore throat.   Eyes: Negative for visual disturbance.  Respiratory: Negative for shortness of breath.   Cardiovascular: Negative for chest pain.  Gastrointestinal: Negative for abdominal pain, nausea and vomiting.  Genitourinary: Negative for difficulty urinating.  Musculoskeletal: Negative for back pain and neck stiffness.  Skin: Negative for rash.  Neurological: Positive for facial  asymmetry and speech difficulty. Negative for dizziness, syncope, weakness, numbness and headaches.     Physical Exam Updated Vital Signs BP 131/82 (BP Location: Left Arm)    Pulse 95    Temp 98.2 F (36.8 C) (Oral)    Resp 17    SpO2 99%   Physical Exam Vitals signs and nursing note reviewed.  Constitutional:      General: He is not in acute distress.    Appearance: He is well-developed. He is not diaphoretic.  HENT:     Head: Normocephalic and atraumatic.  Eyes:     Conjunctiva/sclera: Conjunctivae normal.  Neck:     Musculoskeletal: Normal range of motion.  Cardiovascular:     Rate and Rhythm: Normal rate and regular rhythm.     Heart sounds: Normal heart sounds. No murmur. No friction rub. No gallop.   Pulmonary:     Effort: Pulmonary effort is normal. No respiratory distress.     Breath sounds: Normal breath sounds. No wheezing or rales.  Abdominal:     General: There is no distension.     Palpations: Abdomen is soft.     Tenderness: There is no abdominal tenderness. There is no guarding.  Skin:    General: Skin is warm and dry.  Neurological:     Mental Status: He is alert and oriented to person, place, and time.     GCS: GCS eye subscore is 4. GCS verbal subscore is 5. GCS motor subscore is 6.     Cranial Nerves: Dysarthria and facial asymmetry (right facial droop) present.     Sensory: Sensation is intact.     Motor: Motor function is intact. No weakness.     Coordination: Coordination is intact.      ED Treatments / Results  Labs (all labs ordered are listed, but only abnormal results are displayed) Labs Reviewed  CBC - Abnormal; Notable for the following components:      Result Value   Hemoglobin 17.1 (*)    All other components within normal limits  COMPREHENSIVE METABOLIC PANEL - Abnormal; Notable for the following components:   CO2 20 (*)    Glucose, Bld 148 (*)    BUN 22 (*)    AST 46 (*)    ALT 69 (*)    All other components within normal limits   I-STAT CHEM 8, ED - Abnormal; Notable for the following components:   BUN 26 (*)    Glucose, Bld 149 (*)    Hemoglobin 17.7 (*)    All other components within normal limits  SARS CORONAVIRUS 2 (TAT 6-24 HRS)  PROTIME-INR  APTT  DIFFERENTIAL  HIV ANTIBODY (ROUTINE TESTING W REFLEX)  TSH  CK  RAPID URINE DRUG SCREEN, HOSP PERFORMED  HEMOGLOBIN A1C  LIPID PANEL  CBG MONITORING, ED    EKG EKG Interpretation  Date/Time:  Friday August 23 2019 10:12:56 EST Ventricular Rate:  120 PR Interval:  158 QRS Duration: 84 QT Interval:  322 QTC Calculation: 455 R Axis:   12 Text Interpretation: Sinus tachycardia Otherwise normal ECG Since prior ECG< rate has increased Confirmed by Gareth Morgan [phone]) on 08/23/2019 10:59:44 AM   Radiology Ct Head Wo Contrast  Result Date: 08/23/2019 CLINICAL DATA:  Slurred speech with right facial droop EXAM: CT HEAD WITHOUT CONTRAST TECHNIQUE: Contiguous axial images were obtained from the base of the skull through the vertex without intravenous contrast. COMPARISON:  None. FINDINGS: Brain: Ventricles and sulci are within normal limits for age. There is no evident mass, hemorrhage, extra-axial fluid collection, or midline shift. There is an acute appearing infarct involving the genu and much of the posterior limb of the left internal capsule with involvement of the medial posterior lentiform nucleus on the left as well. There is evidence of a punctate area of decreased attenuation in the right thalamus, probably indicative of a prior small infarct in this area. Elsewhere, the brain parenchyma appears unremarkable. Vascular: There is no hyperdense vessel. There is no appreciable vascular calcification. Skull: The bony calvarium appears intact. Sinuses/Orbits: There is a retention cyst in the inferior right maxillary antrum. There is mucosal thickening and opacification in multiple ethmoid air cells as well as in the inferior right frontal sinus. There is a  retention cyst in the posterior right sphenoid sinus with mucosal thickening in the anterior right sphenoid sinus. Orbits appear symmetric bilaterally. Other: Mastoid air cells are clear. IMPRESSION: 1. Acute appearing infarct involving the genu and much of the posterior limb of the left internal capsule. There appears to be involvement of the immediately adjacent medial aspect of the posterior left lentiform nucleus. No other acute infarct evident. 2.  Likely prior tiny infarct in the right thalamus. 3.  No mass or hemorrhage. 4.  Multifocal paranasal sinus disease. Electronically Signed   By: Lowella Grip III [name]   On: 08/23/2019 12:29   Mr Angio Head Wo Contrast  Result Date: 08/23/2019 CLINICAL DATA:  Stroke, follow-up. Additional history provided: History of seizures, presenting with slurred speech and facial droop. EXAM: MRI HEAD WITHOUT CONTRAST MRA HEAD WITHOUT CONTRAST TECHNIQUE: Multiplanar, multiecho pulse sequences of the brain and surrounding structures were obtained without intravenous contrast. Angiographic images of the head were obtained using MRA technique without contrast. COMPARISON:  Head CT 08/31/2019 FINDINGS: MRI HEAD FINDINGS Brain: There is an acute infarct centered within the left thalamocapsular junction, also involving portions of the left internal capsule genu and left lentiform nucleus. The infarct measures 2.6 x 0.9 x 1.8 cm (AP x TV x CC). Corresponding T2/FLAIR hyperintensity at this site. No evidence of intracranial mass. No midline shift or extra-axial fluid collection. No chronic intracranial blood products. Small chronic lacunar infarcts within the right thalamus, right pons and left cerebellum. There is a background of mild chronic small vessel ischemic disease. Cerebral volume is normal for age. Vascular: Reported separately Skull and upper cervical spine: No focal marrow lesion. Sinuses/Orbits: Visualized orbits demonstrate no acute abnormality. Mild scattered  paranasal sinus mucosal thickening. Moderate-sized right maxillary sinus mucous retention cyst. No significant mastoid effusion. MRA HEAD FINDINGS The intracranial internal carotid arteries are patent with only mild atherosclerotic irregularity. The M1 right middle cerebral artery is patent without significant stenosis. The M2 right MCA branches are patent without proximal branch occlusion. The right anterior cerebral artery is patent without significant proximal stenosis. The M1 left middle cerebral artery is patent without significant stenosis. The M2 left MCA branches are patent without  proximal branch occlusion. The left anterior cerebral artery is patent without significant proximal stenosis No intracranial aneurysm is identified. Dominant intracranial right vertebral artery without significant stenosis. Non dominant intracranial left vertebral artery with a mild focal stenosis beyond the origin of the left PICA. The basilar artery is patent without significant stenosis. The bilateral posterior cerebral arteries are patent without significant proximal stenosis. Posterior communicating arteries are poorly delineated and may be hypoplastic or absent bilaterally. IMPRESSION: MRI brain: 1. 2.6 x 0.9 x 1.8 cm acute infarct centered within the left thalamocapsular junction, also involving portions of the left internal capsule genu and left lentiform nucleus. 2. Background of mild chronic small vessel ischemic disease with several small chronic lacunar infarcts. MRA head: 1. No intracranial large vessel occlusion or proximal high-grade arterial stenosis. 2. Mild atherosclerotic irregularity of the intracranial internal carotid arteries. 3. Mild focal stenosis within the non dominant left vertebral artery beyond the left PICA origin. Electronically Signed   By: Jackey LogeKyle  Golden DO   On: 08/23/2019 16:37   Mr Brain Wo Contrast  Result Date: 08/23/2019 CLINICAL DATA:  Stroke, follow-up. Additional history provided:  History of seizures, presenting with slurred speech and facial droop. EXAM: MRI HEAD WITHOUT CONTRAST MRA HEAD WITHOUT CONTRAST TECHNIQUE: Multiplanar, multiecho pulse sequences of the brain and surrounding structures were obtained without intravenous contrast. Angiographic images of the head were obtained using MRA technique without contrast. COMPARISON:  Head CT 08/31/2019 FINDINGS: MRI HEAD FINDINGS Brain: There is an acute infarct centered within the left thalamocapsular junction, also involving portions of the left internal capsule genu and left lentiform nucleus. The infarct measures 2.6 x 0.9 x 1.8 cm (AP x TV x CC). Corresponding T2/FLAIR hyperintensity at this site. No evidence of intracranial mass. No midline shift or extra-axial fluid collection. No chronic intracranial blood products. Small chronic lacunar infarcts within the right thalamus, right pons and left cerebellum. There is a background of mild chronic small vessel ischemic disease. Cerebral volume is normal for age. Vascular: Reported separately Skull and upper cervical spine: No focal marrow lesion. Sinuses/Orbits: Visualized orbits demonstrate no acute abnormality. Mild scattered paranasal sinus mucosal thickening. Moderate-sized right maxillary sinus mucous retention cyst. No significant mastoid effusion. MRA HEAD FINDINGS The intracranial internal carotid arteries are patent with only mild atherosclerotic irregularity. The M1 right middle cerebral artery is patent without significant stenosis. The M2 right MCA branches are patent without proximal branch occlusion. The right anterior cerebral artery is patent without significant proximal stenosis. The M1 left middle cerebral artery is patent without significant stenosis. The M2 left MCA branches are patent without proximal branch occlusion. The left anterior cerebral artery is patent without significant proximal stenosis No intracranial aneurysm is identified. Dominant intracranial right  vertebral artery without significant stenosis. Non dominant intracranial left vertebral artery with a mild focal stenosis beyond the origin of the left PICA. The basilar artery is patent without significant stenosis. The bilateral posterior cerebral arteries are patent without significant proximal stenosis. Posterior communicating arteries are poorly delineated and may be hypoplastic or absent bilaterally. IMPRESSION: MRI brain: 1. 2.6 x 0.9 x 1.8 cm acute infarct centered within the left thalamocapsular junction, also involving portions of the left internal capsule genu and left lentiform nucleus. 2. Background of mild chronic small vessel ischemic disease with several small chronic lacunar infarcts. MRA head: 1. No intracranial large vessel occlusion or proximal high-grade arterial stenosis. 2. Mild atherosclerotic irregularity of the intracranial internal carotid arteries. 3. Mild focal stenosis within the non  dominant left vertebral artery beyond the left PICA origin. Electronically Signed   By: Jackey Loge DO   On: 08/23/2019 16:37    Procedures Procedures (including critical care time)  Medications Ordered in ED Medications  HYDROcodone-acetaminophen (NORCO) 10-325 MG per tablet 1 tablet (has no administration in time range)  metoprolol tartrate (LOPRESSOR) tablet 25 mg (25 mg Oral Given 08/23/19 2103)  enoxaparin (LOVENOX) injection 40 mg (has no administration in time range)  0.9 %  sodium chloride infusion ( Intravenous Transfusing/Transfer 08/23/19 1856)  acetaminophen (TYLENOL) tablet 650 mg (has no administration in time range)    Or  acetaminophen (TYLENOL) 160 MG/5ML solution 650 mg (has no administration in time range)    Or  acetaminophen (TYLENOL) suppository 650 mg (has no administration in time range)  senna-docusate (Senokot-S) tablet 1 tablet (has no administration in time range)  aspirin suppository 300 mg ( Rectal See Alternative 08/23/19 1618)    Or  aspirin tablet 325 mg  (325 mg Oral Given 08/23/19 1618)  atorvastatin (LIPITOR) tablet 40 mg (40 mg Oral Given 08/23/19 1859)  nicotine (NICODERM CQ - dosed in mg/24 hours) patch 21 mg (has no administration in time range)  LORazepam (ATIVAN) tablet 1-4 mg (has no administration in time range)    Or  LORazepam (ATIVAN) injection 1-4 mg (has no administration in time range)  thiamine (VITAMIN B-1) tablet 100 mg (100 mg Oral Given 08/23/19 1616)    Or  thiamine (B-1) injection 100 mg ( Intravenous See Alternative 08/23/19 1616)  folic acid (FOLVITE) tablet 1 mg (1 mg Oral Given 08/23/19 1617)  multivitamin with minerals tablet 1 tablet (1 tablet Oral Given 08/23/19 1617)  losartan (COZAAR) tablet 100 mg (100 mg Oral Given 08/23/19 1617)    And  hydrochlorothiazide (HYDRODIURIL) tablet 25 mg (25 mg Oral Given 08/23/19 1617)  perflutren lipid microspheres (DEFINITY) IV suspension (5 mLs Intravenous Given 08/23/19 1702)  sodium chloride flush (NS) 0.9 % injection 3 mL (3 mLs Intravenous Given 08/23/19 1143)   stroke: mapping our early stages of recovery book ( Does not apply Given 08/23/19 1620)     Initial Impression / Assessment and Plan / ED Course  I have reviewed the triage vital signs and the nursing notes.  Pertinent labs & imaging results that were available during my care of the patient were reviewed by me and considered in my medical decision making (see chart for details).       53 year old male with a history of hypertension, hyperlipidemia, smoking, seizure disorder presents with concern for right-sided facial droop and difficulty speaking.  Has good strength of forehead muscles and significant facial droop and dysarthria concerning for central etiology of facial droop rather than Bells Palsy.  Last known normal 2 days ago, out of code stroke window.  CT head completed shows posterior limb of left internal capsule.  Labs without significant findings.  Discussed with Dr. Otelia Limes of Neurology who will  evaluate the patient.   MRI/MRA head ordered.   Pt admitted for stroke evaluation.    Final Clinical Impressions(s) / ED Diagnoses   Final diagnoses:  Cerebrovascular accident (CVA), unspecified mechanism [facility])    ED Discharge Orders    None       Alvira Monday, MD 08/23/19 2105

## 2019-08-23 NOTE — ED Notes (Signed)
Patient transported to MRI 

## 2019-08-23 NOTE — H&P (Signed)
History and Physical    Anthony Harrington ZOX:[id number] DOB: Nov 03, 1965 DOA: 08/23/2019  PCP: Myrlene Broker, MD Consultants:  Charlsie Merles - podiatry Patient coming from:  Home - lives with partner; [facility]: Partner, Alinda Money, [phone]  Chief Complaint: facial droop, slurred speech  HPI: Anthony Harrington is a 53 y.o. male with medical history significant of seizures; HTN; HLD; and tobacco dependence presenting with slurred speech and facial droop.   He reports having symptoms Monday night.  His partner reports he was fine Monday but not the same Tuesday night.  They noticed facial droop and slurred speech.  "Stubbornness" prevented him from coming in sooner.  He denies N/W/T of arms, legs; however, he did have mild ataxia.  No confusion.  No dysphagia.  His partner made him come to the ER today.   ED Course:   Admission for CVA - R-sided facial droop and dysarthria.  Neurology consulted.  CT shows CVA.  Review of Systems: As per HPI; otherwise review of systems reviewed and negative.   Ambulatory Status:  Ambulates without assistance  Past Medical History:  Diagnosis Date  . CVA (cerebral vascular accident) (HCC) 08/2019  . DDD (degenerative disc disease)    lumbar and cervical  . Hyperlipidemia   . Hypertension   . Seizures (HCC)    x 1 in his teen years    Past Surgical History:  Procedure Laterality Date  . FRACTURE SURGERY     left hip  . lumbar spine surgery  2003   microdiskectomy L5-S1 Noel Gerold)    Social History   Socioeconomic History  . Marital status: Media planner    Spouse name: Not on file  . Number of children: 0  . Years of education: 10  . Highest education level: Not on file  Occupational History  . Occupation: Production designer, theatre/television/film  Social Needs  . Financial resource strain: Not on file  . Food insecurity    Worry: Not on file    Inability: Not on file  . Transportation needs    Medical: Not on file    Non-medical: Not on file  Tobacco Use  . Smoking status:  Current Every Day Smoker    Packs/day: 2.00    Years: 36.00    Pack years: 72.00    Types: Cigarettes  . Smokeless tobacco: Never Used  Substance and Sexual Activity  . Alcohol use: Yes    Alcohol/week: 14.0 standard drinks    Types: 14 Standard drinks or equivalent per week    Comment: 48 oz daily; does have a problem with ETOH, last drink last night  . Drug use: No  . Sexual activity: Yes    Partners: Female  Lifestyle  . Physical activity    Days per week: Not on file    Minutes per session: Not on file  . Stress: Not on file  Relationships  . Social Musician on phone: Not on file    Gets together: Not on file    Attends religious service: Not on file    Active member of club or organization: Not on file    Attends meetings of clubs or organizations: Not on file    Relationship status: Not on file  . Intimate partner violence    Fear of current or ex partner: Not on file    Emotionally abused: Not on file    Physically abused: Not on file    Forced sexual activity: Not on file  Other Topics Concern  .  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. Work - [employer] - does a little of everything. Lives alone.     Allergies  Allergen Reactions  . Lisinopril Rash    Family History  Problem Relation Age of Onset  . Hypertension Mother   . Diabetes Father   . Heart disease Father   . Hyperlipidemia Father   . Stroke Father   . Diabetes Brother   . Heart disease Brother 17  . COPD Neg Hx   . Cancer Neg Hx     Prior to Admission medications   Medication Sig Start Date End Date Taking? Authorizing Provider  fluticasone (FLONASE) 50 MCG/ACT nasal spray Place 2 sprays into both nostrils daily. 01/30/15   Myrlene Broker, MD  HYDROcodone-acetaminophen (NORCO) 10-325 MG tablet Take 1 tablet by mouth 2 (two) times daily as needed for moderate pain. 11/12/15   Myrlene Broker, MD  losartan-hydrochlorothiazide  (HYZAAR) 100-25 MG tablet Take 1 tablet by mouth daily. NEEDS ANNUAL VISIT FOR FURTHER REFILLS 09/04/17   Myrlene Broker, MD  metoprolol tartrate (LOPRESSOR) 25 MG tablet TAKE ONE TABLET BY MOUTH TWICE DAILY 01/13/17   Myrlene Broker, MD    Physical Exam: Vitals:   08/23/19 1200 08/23/19 1230 08/23/19 1250 08/23/19 1300  BP: (!) 176/116 (!) 163/105 (!) 163/105 (!) 166/90  Pulse:   86 82  Resp: 20 14 16 17   Temp:      TempSrc:      SpO2:   97% 97%     . General:  Appears calm and comfortable and is NAD with obvious right-sided facial droop and dysarthria . Eyes:  PERRL, EOMI, normal lids, iris . ENT:  grossly normal hearing, lips & tongue, mmm; appropriate dentition . Neck:  no LAD, masses or thyromegaly; no carotid bruits . Cardiovascular:  RR with mild tachycardia, no m/r/g. No LE edema.  Respiratory:   CTA bilaterally with no wheezes/rales/rhonchi.  Normal respiratory effort. . Abdomen:  soft, NT, ND, NABS . Skin:  no rash or induration seen on limited exam . Musculoskeletal:  grossly normal tone BUE/BLE with possible subtle right UE > LE deficits vs. Mild neglect, good ROM, no bony abnormality . Psychiatric:  blunted mood and affect, speech dysarthric without apparent word finding abnormalities, AOx3 . Neurologic:  Right-sided facial droop with dysmotility of R face, moves all extremities in coordinated fashion, sensation intact    Radiological Exams on Admission: Ct Head Wo Contrast  Result Date: 08/23/2019 CLINICAL DATA:  Slurred speech with right facial droop EXAM: CT HEAD WITHOUT CONTRAST TECHNIQUE: Contiguous axial images were obtained from the base of the skull through the vertex without intravenous contrast. COMPARISON:  None. FINDINGS: Brain: Ventricles and sulci are within normal limits for age. There is no evident mass, hemorrhage, extra-axial fluid collection, or midline shift. There is an acute appearing infarct involving the genu and much of the  posterior limb of the left internal capsule with involvement of the medial posterior lentiform nucleus on the left as well. There is evidence of a punctate area of decreased attenuation in the right thalamus, probably indicative of a prior small infarct in this area. Elsewhere, the brain parenchyma appears unremarkable. Vascular: There is no hyperdense vessel. There is no appreciable vascular calcification. Skull: The bony calvarium appears intact. Sinuses/Orbits: There is a retention cyst in the inferior right maxillary antrum. There is mucosal thickening and opacification in multiple ethmoid air cells as well  as in the inferior right frontal sinus. There is a retention cyst in the posterior right sphenoid sinus with mucosal thickening in the anterior right sphenoid sinus. Orbits appear symmetric bilaterally. Other: Mastoid air cells are clear. IMPRESSION: 1. Acute appearing infarct involving the genu and much of the posterior limb of the left internal capsule. There appears to be involvement of the immediately adjacent medial aspect of the posterior left lentiform nucleus. No other acute infarct evident. 2.  Likely prior tiny infarct in the right thalamus. 3.  No mass or hemorrhage. 4.  Multifocal paranasal sinus disease. Electronically Signed   By: Bretta BangWilliam  Woodruff III [name]   On: 08/23/2019 12:29    EKG: Independently reviewed.  Sinus tachycardia with rate 120; nonspecific ST changes with no evidence of acute ischemia   Labs on Admission: I have personally reviewed the available labs and imaging studies at the time of the admission.  Pertinent labs:   CO2 20 Glucose 148 AST 46/ALT 69/Bili 1.1 Unremarkable CBC (Hgb 17.1) INR 1.0 COVID pending   Assessment/Plan Principal Problem:   CVA (cerebral vascular accident) (HCC) Active Problems:   Dyslipidemia   CIGARETTE SMOKER   Essential hypertension   Class 2 obesity with body mass index (BMI) of 36.0 to 36.9 in adult    CVA -Developed  symptoms strongly suggestive of CVA 4 days ago -CT confirms CVA -Will admit for further CVA evaluation -Telemetry monitoring -MRI/MRA -Carotid dopplers -Echo -Risk stratification with FLP, A1c; will also check TSH and UDS -ASA daily -Neurology consult -PT/OT/ST/Nutrition Consults  HTN -Does not require permissive HTN since he is out of the window -He stopped taking HTN meds remotely, will resume both Losartan/HCTZ and Lopressor -BP goal is 120/80   HLD -Check FLP -Will start empiric Lipitor 40 mg daily   Hyperglycemia -Body habitus and hyperglycemia is suggestive of DM -Will check A1c  Obesity -BMI 36 -Weight loss should be encouraged -Outpatient PCP/bariatric medicine/bariatric surgery f/u encouraged  Tobacco dependence -Tobacco Dependence: encourage cessation.   -This was discussed with the patient and should be reviewed on an ongoing basis.   -Patch ordered for prn use since patient declined (but smokes 2 ppd)  ETOH dependence -Patient with chronic ETOH dependence -He is at increased risk for complications of withdrawal including seizures, DTs -Will observe -CIWA protocol -SW consult -Minimally elevated LFTs are likely related to alcoholism     Note: This patient has been tested and is negative for the novel coronavirus COVID-19.      DVT prophylaxis:  Lovenox  Code Status: Full - confirmed with patient/family Family Communication: Daughter present throughout evaluation Disposition Plan:  Home once clinically improved Consults called: Neurology; PT/OT/ST/SW/Nutrition  Admission status: Admit - It is my clinical opinion that admission to INPATIENT is reasonable and necessary because of the expectation that this patient will require hospital care that crosses at least 2 midnights to treat this condition based on the medical complexity of the problems presented.  Given the aforementioned information, the predictability of an adverse outcome is felt to be  significant.    Jonah BlueJennifer Taiwan Millon MD [facility]   How to contact the [facility] Attending or Consulting provider 7A - 7P or covering provider during after hours 7P -7A, for this patient?  1. Check the care team in [facility] and look for a) attending/consulting [facility] provider listed and b) the [facility] team listed 2. Log into [email or web address] and use [place]'s universal password to access. If you do not have the password, please contact  the hospital operator. 3. Locate the [facility] provider you are looking for under [facility] and page to a number that you can be directly reached. 4. If you still have difficulty reaching the provider, please page the [facility] (Director on Call) for the Hospitalists listed on amion for assistance.   08/23/2019, 3:45 PM

## 2019-08-23 NOTE — Progress Notes (Signed)
  Echocardiogram 2D Echocardiogram has been performed with [place] 08/23/2019, 5:25 PM

## 2019-08-23 NOTE — ED Notes (Signed)
Heart healthy dinner tray ordered 

## 2019-08-23 NOTE — ED Triage Notes (Signed)
Pt with 2 days of right facial droop and slurred speech. No other deficits noted. Will proceed with stroke workup.

## 2019-08-23 NOTE — Progress Notes (Signed)
The pateint is admitted to [address] with the diagnosis of CVA. A & O x 4. Denied any acute pain. The patient is oriented to his room,ascom/call bell and staff.Patient voiced no concern.  Will continue to monitor.

## 2019-08-23 NOTE — ED Notes (Signed)
Patient transported to CT 

## 2019-08-23 NOTE — Consult Note (Signed)
Referring Physician: Dr. Billy Fischer    Chief Complaint: Right facial droop and dysarthria.   HPI: Anthony Harrington is an 53 y.o. male who presents to the ED today with a 2 day history of right facial droop and slurred speech. His symptoms began on Monday acutely and have not progressed. He denies any other symptoms including no vision loss, double vision, limb weakness, sensory numbness, tinnitus, headache, cough, fever or limb incoordination. He has a PMHx of HTN, HLD and seizures. No prior history of stroke or MI. He does not on an antiplatelet agent. Denies hyperacusis or taste loss.   LSN: Monday tPA Given: No: Out of the time window  Past Medical History:  Diagnosis Date  . DDD (degenerative disc disease)    lumbar and cervical  . Hyperlipidemia   . Hypertension   . Seizures ([place])     Past Surgical History:  Procedure Laterality Date  . FRACTURE SURGERY     left hip  . lumbar spine surgery  2003   microdiskectomy L5-S1 [facility] [place])    Family History  Problem Relation Age of Onset  . Hypertension Mother   . Diabetes Father   . Heart disease Father   . Hyperlipidemia Father   . Stroke Father   . Diabetes Brother   . Heart disease Brother   . COPD Neg Hx   . Cancer Neg Hx    Social History:  reports that he has been smoking cigarettes. He has never used smokeless tobacco. He reports current alcohol use of about 4.0 standard drinks of alcohol per week. He reports that he does not use drugs.  Allergies:  Allergies  Allergen Reactions  . Lisinopril Rash    Home Medications:  No current facility-administered medications on file prior to encounter.    Current Outpatient Medications on File Prior to Encounter  Medication Sig Dispense Refill  . fluticasone (FLONASE) 50 MCG/ACT nasal spray Place 2 sprays into both nostrils daily. 16 g 6  . HYDROcodone-acetaminophen (NORCO) 10-325 MG tablet Take 1 tablet by mouth 2 (two) times daily as needed for moderate pain. 60 tablet 0  .  losartan-hydrochlorothiazide (HYZAAR) 100-25 MG tablet Take 1 tablet by mouth daily. NEEDS ANNUAL VISIT FOR FURTHER REFILLS 30 tablet 0  . metoprolol tartrate (LOPRESSOR) 25 MG tablet TAKE ONE TABLET BY MOUTH TWICE DAILY 180 tablet 1     ROS: As per HPI. Comprehensive ROS otherwise negative.   Physical Examination: Blood pressure (!) 176/116, pulse (!) 104, temperature 98.2 F (36.8 C), temperature source Oral, resp. rate 20, SpO2 95 %.  HEENT: [place]/AT Lungs: Respirations unlabored Ext: No edema  Neurologic Examination: Mental Status: Alert, fully oriented, thought content appropriate.  Speech fluent without evidence of aphasia.  Able to follow all commands without difficulty. Cranial Nerves: II:  Visual fields intact with no extinction to DSS. PERRL. III,IV, VI: Subtle decreased height of right palpebral fissure. EOM are full. There is nystagmus with far right gaze that resolves after about 3 beats. No double vision.  V,VII: Prominent weakness of right lower quadrant of face. Subtle weakness of right upper quadrant. Temp and FT sensation equal bilaterally.  VIII: Hearing equal to pen click bilaterally.  IX,X: Pharyngeal dysarthria noted in addition to nasal speech.  XI: Subtle lag on right with shoulder shrug XII: midline tongue extension  Motor: RUE with subtle 4+/5 weakness and strongly positive orbiting fingers test. Mild right sided drift.  RLE 5/5 LUE and LLE 5/5 Sensory: Temp and light touch intact  throughout, bilaterally Deep Tendon Reflexes:  1+ bilateral upper extremities and patellae Plantars: Right: downgoing  Left: downgoing Cerebellar: No ataxia with FNF and H-S bilaterally Gait: Deferred   Results for orders placed or performed during the hospital encounter of 08/23/19 (from the past 48 hour(s))  Protime-INR     Status: None   Collection Time: 08/23/19 10:28 AM  Result Value Ref Range   Prothrombin Time 12.7 11.4 - 15.2 seconds   INR 1.0 0.8 - 1.2    Comment:  (NOTE) INR goal varies based on device and disease states. Performed at [facility], [address], [place], [place] [address]   APTT     Status: None   Collection Time: 08/23/19 10:28 AM  Result Value Ref Range   aPTT 27 24 - 36 seconds    Comment: Performed at [facility], [address] [place], [place] [address]  CBC     Status: Abnormal   Collection Time: 08/23/19 10:28 AM  Result Value Ref Range   WBC 8.4 4.0 - 10.5 K/uL   RBC 5.03 4.22 - 5.81 MIL/uL   Hemoglobin 17.1 (H) 13.0 - 17.0 g/dL   HCT 76.7 34.1 - 93.7 %   MCV 99.4 80.0 - 100.0 fL   MCH 34.0 26.0 - 34.0 pg   MCHC 34.2 30.0 - 36.0 g/dL   RDW 90.2 40.9 - 73.5 %   Platelets 218 150 - 400 K/uL   nRBC 0.0 0.0 - 0.2 %    Comment: Performed at [facility], [address], [place], [place] [address]  Differential     Status: None   Collection Time: 08/23/19 10:28 AM  Result Value Ref Range   Neutrophils Relative % 67 %   Neutro Abs 5.6 1.7 - 7.7 K/uL   Lymphocytes Relative 23 %   Lymphs Abs 2.0 0.7 - 4.0 K/uL   Monocytes Relative 8 %   Monocytes Absolute 0.7 0.1 - 1.0 K/uL   Eosinophils Relative 1 %   Eosinophils Absolute 0.1 0.0 - 0.5 K/uL   Basophils Relative 1 %   Basophils Absolute 0.1 0.0 - 0.1 K/uL   Immature Granulocytes 0 %   Abs Immature Granulocytes 0.03 0.00 - 0.07 K/uL    Comment: Performed at [facility], [address] [place], [place] [address]  Comprehensive metabolic panel     Status: Abnormal   Collection Time: 08/23/19 10:28 AM  Result Value Ref Range   Sodium 139 135 - 145 mmol/L   Potassium 3.9 3.5 - 5.1 mmol/L   Chloride 104 98 - 111 mmol/L   CO2 20 (L) 22 - 32 mmol/L   Glucose, Bld 148 (H) 70 - 99 mg/dL   BUN 22 (H) 6 - 20 mg/dL   Creatinine, Ser 4.19 0.61 - 1.24 mg/dL   Calcium 62.2 8.9 - 29.7 mg/dL   Total Protein 7.6 6.5 - 8.1 g/dL   Albumin 4.8 3.5 - 5.0 g/dL   AST 46 (H) 15 - 41 U/L   ALT 69 (H) 0 - 44 U/L   Alkaline Phosphatase 75 38 - 126 U/L    Total Bilirubin 1.1 0.3 - 1.2 mg/dL   GFR calc non Af Amer >60 >60 mL/min   GFR calc Af Amer >60 >60 mL/min   Anion gap 15 5 - 15    Comment: Performed at [facility], [address], [place], [place] [address]  I-stat chem 8, ED  Status: Abnormal   Collection Time: 08/23/19 10:38 AM  Result Value Ref Range   Sodium 138 135 - 145 mmol/L   Potassium 3.8 3.5 - 5.1 mmol/L   Chloride 104 98 - 111 mmol/L   BUN 26 (H) 6 - 20 mg/dL   Creatinine, Ser 1.610.70 0.61 - 1.24 mg/dL   Glucose, Bld 096149 (H) 70 - 99 mg/dL   Calcium, Ion 0.451.20 1.15 - 1.40 mmol/L   TCO2 22 22 - 32 mmol/L   Hemoglobin 17.7 (H) 13.0 - 17.0 g/dL   HCT 40.952.0 81.139.0 - 91.452.0 %   No results found.  Assessment: 53 y.o. male presenting with new onset of right facial droop and dysarthria. Symptoms began acutely on Monday and have not progressed.  1. History and exam findings best localize as a left basal ganglion, internal capsule, pontine or corona radiata small vessel ischemic infarction.   2. CT head is pending.  3. Stroke Risk Factors - HLD and HTN 4. History of seizures.   Plan: 1. HgbA1c, fasting lipid panel 2. MRI, MRA of the brain without contrast 3. PT consult, OT consult, Speech consult 4. Echocardiogram 5. Carotid dopplers 6. Prophylactic therapy- Start ASA 81 mg po qd 7. Risk factor modification 8. Telemetry monitoring 9. Frequent neuro checks 10. Start atorvastatin 40 mg po qd. Obtain baseline CK level.  11. BP management with goal of 120/80. Out of permissive HTN time window.    @Electronically  signed: Dr. Caryl PinaEric Alixandra Alfieri@  08/23/2019, 12:07 PM

## 2019-08-24 ENCOUNTER — Inpatient Hospital Stay (HOSPITAL_COMMUNITY): Payer: 59

## 2019-08-24 DIAGNOSIS — I639 Cerebral infarction, unspecified: Principal | ICD-10-CM

## 2019-08-24 LAB — LIPID PANEL
Cholesterol: 258 mg/dL — ABNORMAL HIGH (ref 0–200)
HDL: 38 mg/dL — ABNORMAL LOW (ref >40)
LDL Cholesterol: 175 mg/dL — ABNORMAL HIGH (ref 0–99)
Total CHOL/HDL Ratio: 6.8 RATIO
Triglycerides: 225 mg/dL — ABNORMAL HIGH (ref <150)
VLDL: 45 mg/dL — ABNORMAL HIGH (ref 0–40)

## 2019-08-24 LAB — HEMOGLOBIN A1C
Hgb A1c MFr Bld: 5.8 % — ABNORMAL HIGH (ref 4.8–5.6)
Mean Plasma Glucose: 119.76 mg/dL

## 2019-08-24 NOTE — Progress Notes (Signed)
Carotid duplex       has been completed. Preliminary results can be found under CV proc through chart review. Koula Venier, BS, RDMS, RVT   

## 2019-08-24 NOTE — Progress Notes (Signed)
Patient ambulated around nurse's station and back to room with walker and stand by assist.  Patient moved quickly, refusing to put on shoes or slipper socks.  Gait steady.

## 2019-08-24 NOTE — Evaluation (Signed)
Speech Language Pathology Evaluation Patient Details Name: Anthony Harrington MRN: [id number] DOB: [birth year] Today's Date: 08/24/2019 Time: 9937-1696 SLP Time Calculation (min) (ACUTE ONLY): 20 min  Problem List:  Patient Active Problem List   Diagnosis Date Noted  . CVA (cerebral vascular accident) (HCC) 08/23/2019  . Class 2 obesity with body mass index (BMI) of 36.0 to 36.9 in adult 08/23/2019  . EtOH dependence (HCC) 08/23/2019  . Mood disorder (HCC) 07/28/2014  . Hypercalcemia 03/15/2014  . DDD (degenerative disc disease), lumbar 12/27/2012  . Routine health maintenance 02/12/2012  . CIGARETTE SMOKER 05/18/2010  . Dyslipidemia 10/12/2007  . Essential hypertension 10/12/2007  . HERNIATED CERVICAL DISC 10/12/2007  . Seizure disorder (HCC) 10/12/2007   Past Medical History:  Past Medical History:  Diagnosis Date  . CVA (cerebral vascular accident) (HCC) 08/2019  . DDD (degenerative disc disease)    lumbar and cervical  . Hyperlipidemia   . Hypertension   . Seizures (HCC)    x 1 in his teen years   Past Surgical History:  Past Surgical History:  Procedure Laterality Date  . FRACTURE SURGERY     left hip  . lumbar spine surgery  2003   microdiskectomy L5-S1 Noel Gerold)   HPI:  Anthony Harrington is a 53 y.o. male with medical history significant of seizures; HTN; HLD; and tobacco dependence presenting with slurred speech and facial droop. CT reveals acute CVA in posterior limb of left internal capsule.   Assessment / Plan / Recommendation Clinical Impression  Patient exhibits moderate flaccid dysarthria resulting in significant reduction in speech intelligibility at word and phrase level; he also exhibits a moderate cognitive-linguisitic impairment which presented as deficits in areas of: attention, delayed recall/memory storage, problem solving, awareness, reasoning, but with good naming, time/place orientation and visuoperception. Patient's partner was in the room and he did say  that patient had some difficulties with attention and getting frustrated easily, but SLP suspects this is either new from CVA or exacerbated by CVA. Patient would give up quickly on tasks, had difficulty initiating responses, perseverated during generative naming task and inability to demonstrate delayed recall after 90 seconds.    SLP Assessment  SLP Recommendation/Assessment: Patient needs continued Speech Lanaguage Pathology Services SLP Visit Diagnosis: Dysarthria and anarthria (R47.1);Cognitive communication deficit (R41.841)    Follow Up Recommendations  Outpatient SLP    Frequency and Duration min 2x/week  1 week      SLP Evaluation Cognition  Overall Cognitive Status: Impaired/Different from baseline Arousal/Alertness: Awake/alert Orientation Level: Oriented X4 Attention: Sustained Sustained Attention: Impaired Sustained Attention Impairment: Verbal basic;Functional basic;Functional complex Memory: Impaired Memory Impairment: Storage deficit Awareness: Impaired Awareness Impairment: Emergent impairment Problem Solving: Impaired Problem Solving Impairment: Verbal basic;Verbal complex;Functional basic;Functional complex Executive Function: Initiating;Self Monitoring;Reasoning Reasoning: Impaired Reasoning Impairment: Verbal complex;Functional basic;Functional complex Initiating: Impaired Initiating Impairment: Verbal basic;Functional complex Self Monitoring: Impaired Behaviors: Restless;Perseveration;Impulsive Safety/Judgment: Impaired Comments: MOCA-B administered which yielded a score of 15 which is significantly below cutoff score of 26 and indicates cognitive impairment.       Comprehension  Auditory Comprehension Overall Auditory Comprehension: Impaired Yes/No Questions: Within Functional Limits Commands: Within Functional Limits Conversation: Simple Interfering Components: Attention;Processing speed EffectiveTechniques: Extra processing time;Repetition Visual  Recognition/Discrimination Discrimination: Within Function Limits Reading Comprehension Reading Status: Within funtional limits    Expression Expression Primary Mode of Expression: Verbal Verbal Expression Overall Verbal Expression: Impaired Initiation: No impairment Level of Generative/Spontaneous Verbalization: Sentence Repetition: No impairment Naming: No impairment Pragmatics: Impairment Impairments: Abnormal affect Interfering Components: Attention;Speech  intelligibility Effective Techniques: Open ended questions Non-Verbal Means of Communication: Not applicable Written Expression Dominant Hand: Right   Oral / Motor  Oral Motor/Sensory Function Overall Oral Motor/Sensory Function: Moderate impairment Facial ROM: Reduced right Facial Symmetry: Abnormal symmetry right Facial Strength: Reduced right Facial Sensation: Reduced right Lingual ROM: Reduced right Lingual Symmetry: Abnormal symmetry right Lingual Strength: Reduced Motor Speech Overall Motor Speech: Impaired Respiration: Within functional limits Phonation: Normal Resonance: Within functional limits Articulation: Impaired Level of Impairment: Word Intelligibility: Intelligibility reduced Word: 50-74% accurate Phrase: 25-49% accurate Sentence: 25-49% accurate Conversation: Not tested Motor Planning: Witnin functional limits   GO                    Anthony Harrington 08/24/2019, 4:29 PM   Sonia Baller, MA, CCC-SLP Speech Therapy [facility]

## 2019-08-24 NOTE — Progress Notes (Signed)
STROKE TEAM PROGRESS NOTE   HISTORY OF PRESENT ILLNESS (per record) Anthony Harrington is an 53 y.o. male who presents to the ED today with a 2 day history of right facial droop and slurred speech. His symptoms began on Monday acutely and have not progressed. He denies any other symptoms including no vision loss, double vision, limb weakness, sensory numbness, tinnitus, headache, cough, fever or limb incoordination. He has a PMHx of HTN, HLD and seizures. No prior history of stroke or MI. He does not on an antiplatelet agent. Denies hyperacusis or taste loss.   LSN: Monday tPA Given: No: Out of the time window   INTERVAL HISTORY (subjective) Patient doing well, no overnight events    OBJECTIVE Vitals:   08/24/19 0039 08/24/19 0300 08/24/19 0454 08/24/19 0834  BP: (!) 180/168  (!) 160/94 (!) 164/113  Pulse: 68  87 75  Resp: 13  (!) 21 15  Temp:  98.4 F (36.9 C)  99.2 F (37.3 C)  TempSrc:  Oral  Oral  SpO2: 98%  93% 98%    CBC:  Recent Labs  Lab 08/23/19 1028 08/23/19 1038  WBC 8.4  --   NEUTROABS 5.6  --   HGB 17.1* 17.7*  HCT 50.0 52.0  MCV 99.4  --   PLT 218  --     Basic Metabolic Panel:  Recent Labs  Lab 08/23/19 1028 08/23/19 1038  NA 139 138  K 3.9 3.8  CL 104 104  CO2 20*  --   GLUCOSE 148* 149*  BUN 22* 26*  CREATININE 0.79 0.70  CALCIUM 10.0  --     Lipid Panel:     Component Value Date/Time   CHOL 258 (H) 08/24/2019 0214   TRIG 225 (H) 08/24/2019 0214   HDL 38 (L) 08/24/2019 0214   CHOLHDL 6.8 08/24/2019 0214   VLDL 45 (H) 08/24/2019 0214   LDLCALC 175 (H) 08/24/2019 0214   HgbA1c:  Lab Results  Component Value Date   HGBA1C 5.8 (H) 08/24/2019   Urine Drug Screen: No results found for: LABOPIA, COCAINSCRNUR, LABBENZ, AMPHETMU, THCU, LABBARB  Alcohol Level No results found for: ETH  IMAGING  Ct Head Wo Contrast 08/23/2019 IMPRESSION:  1. Acute appearing infarct involving the genu and much of the posterior limb of the left internal  capsule. There appears to be involvement of the immediately adjacent medial aspect of the posterior left lentiform nucleus. No other acute infarct evident.  2.  Likely prior tiny infarct in the right thalamus.  3.  No mass or hemorrhage.  4.  Multifocal paranasal sinus disease.    Mr Brain 103 Contrast Mr Angio Head Wo Contrast  08/23/2019 IMPRESSION:   MRI brain:  1. 2.6 x 0.9 x 1.8 cm acute infarct centered within the left thalamocapsular junction, also involving portions of the left internal capsule genu and left lentiform nucleus.  2. Background of mild chronic small vessel ischemic disease with several small chronic lacunar infarcts.   MRA head:  1. No intracranial large vessel occlusion or proximal high-grade arterial stenosis.  2. Mild atherosclerotic irregularity of the intracranial internal carotid arteries.  3. Mild focal stenosis within the non dominant left vertebral artery beyond the left PICA origin.    Transthoracic Echocardiogram  08/23/2019 Impressions  1. Left ventricular ejection fraction, by visual estimation, is 50 to 55%. The left ventricle has low normal function. There is no left ventricular hypertrophy.  2. Definity contrast agent was given IV to delineate the left  ventricular endocardial borders.  3. The left ventricle has no regional wall motion abnormalities.  4. Global right ventricle has normal systolic function.The right ventricular size is normal. No increase in right ventricular wall thickness.  5. Left atrial size was mildly dilated.  6. Right atrial size was not well visualized.  7. Small pericardial effusion.  8. The mitral valve is normal in structure. No evidence of mitral valve regurgitation. No evidence of mitral stenosis.  9. The tricuspid valve is normal in structure. Tricuspid valve regurgitation is not demonstrated. 10. The aortic valve is tricuspid. Aortic valve regurgitation is not visualized. No evidence of aortic valve sclerosis or  stenosis. 11. The pulmonic valve was normal in structure. Pulmonic valve regurgitation is not visualized. 12. Technically challenging study. Grossly normal LVEF, echo contrast does not demonstrate focal wall motion abnormalities. Reduced sensitivity for detection based on limited windows. No clear cardiac source of emboli.   Bilateral Carotid Dopplers  00/00/2020 Pending  ECG - ST rate 120 BPM. (See cardiology reading for complete details)   PHYSICAL EXAM Blood pressure (!) 164/113, pulse 75, temperature 99.2 F (37.3 C), temperature source Oral, resp. rate 15, SpO2 98 %.  NIHSS 2  Neurologic Examination: Mental Status: Alert, fully oriented, thought content appropriate. Moderate dysarthria without evidence of aphasia.  Able to follow all commands without difficulty. Cranial Nerves: II:  Visual fields intact with no extinction to DSS. PERRL. III,IV, VI: Subtle decreased height of right palpebral fissure. EOM are full.  No double vision.  V,VII: Prominent weakness of right lower quadrant of face.  Temp and FT sensation equal bilaterally.  VIII: Hearing equal to pen click bilaterally.  IX,X: Pharyngeal dysarthria noted in addition to nasal speech.  XII: midline tongue extension  Motor: RUE with subtle 4+/5 weakness and strongly positive orbiting fingers test. Mild right sided drift.  RLE 5/5 LUE and LLE 5/5 Sensory: Temp and light touch intact throughout, bilaterally Deep Tendon Reflexes:  1+ bilateral upper extremities and patellae Plantars: Right: downgoing               Left: downgoing Cerebellar: No ataxia with FNF and H-S bilaterally Gait: Deferred       ASSESSMENT/PLAN Mr. Anthony Harrington is a 53 y.o. male with history of HTN, HLD, tobacco use, ETOH, and seizures presenting with a two day hx of right facial droop and slurred speech. He did not receive IV t-PA due to late presentation (>4.5 hours from time of onset).  Stroke:  acute infarct centered within the left  thalamocapsular junction - small vessel disease source  Code Stroke CT Head - not performed  CT head  - Acute appearing infarct involving the genu and much of the posterior limb of the left internal capsule. There appears to be involvement of the immediately adjacent medial aspect of the posterior left lentiform nucleus. No other acute infarct evident. Likely prior tiny infarct in the right thalamus.   MRI head - 2.6 x 0.9 x 1.8 cm acute infarct centered within the left thalamocapsular junction, also involving portions of the left internal capsule genu and left lentiform nucleus. Background of mild chronic small vessel ischemic disease with several small chronic lacunar infarcts.   MRA head - No intracranial large vessel occlusion or proximal high-grade arterial stenosis.   CTA H&N - not ordered  CT Perfusion - not ordered  Carotid Doppler - pending  2D Echo - EF 50 - 55%.  Reduced sensitivity for detection based on limited windows. No clear  cardiac source of emboli.  Sars Corona Virus 2 - negative  LDL - 175  HgbA1c - 5.8  UDS - pending  VTE prophylaxis - Lovenox Diet  Diet Order            Diet Heart Fluid consistency: Thin  Diet effective ____              No antithrombotic prior to admission, now on aspirin 325 mg daily  Patient counseled to be compliant with his antithrombotic medications  Ongoing aggressive stroke risk factor management  Therapy recommendations:  pending  Disposition:  Pending  Hypertension  Home BP meds: Hyzaar ; metoprolol  Current BP meds: Losartan HCT  Blood pressure somewhat high at times but within post stroke/TIA parameters . Permissive hypertension (OK if < 220/120) but gradually normalize in 5-7 days  . Long-term BP goal normotensive  Hyperlipidemia  Home Lipid lowering medication: none   LDL 175, goal < 70  Current lipid lowering medication: Lipitor 40 mg daily   Continue statin at discharge  Other Stroke Risk  Factors  Cigarette smoker - advised to stop smoking  ETOH use, advised to drink no more than 1 alcoholic beverage per day.  Obesity, There is no height or weight on file to calculate BMI., recommend weight loss, diet and exercise as appropriate   Hx stroke/TIA by imaging  Family hx stroke (father)   Other Active Problems  ETOH hx - thiamine, folic acid, and ativan  Hyperglycemia - pre diabetes   Hospital day # 1    To contact Stroke Continuity provider, please refer to [email or web address]. After hours, contact General Neurology

## 2019-08-24 NOTE — Progress Notes (Signed)
Initial Nutrition Assessment  DOCUMENTATION CODES:   Not applicable  INTERVENTION:   RD will add supplements pending SLP evaluation.   MVI, thiamine and folic acid daily in setting of etoh abuse   Pt likely at refeed risk; recommend monitor K, Mg and P labs   NUTRITION DIAGNOSIS:   Inadequate oral intake related to acute illness(CVA) as evidenced by other (comment)(per chart review).  GOAL:   Patient will meet greater than or equal to 90% of their needs  MONITOR:   PO intake, Supplement acceptance, Labs, Weight trends, Skin, I & O's  REASON FOR ASSESSMENT:   Consult Assessment of nutrition requirement/status  ASSESSMENT:   53 y.o. male with medical history significant of seizure, HTN, HLD and tobacco & etoh dependence admitted with CVA   SLP evaluation pending. RD will add supplements per SLP recommendations. Recommend monitor refeed labs given h/o etoh abuse. There is no weight history in chart to determine if any significant recent weight loss.   Medications reviewed and include: aspirin, lovenox, folic acid, MVI, thiamine    Labs reviewed:   Unable to complete Nutrition-Focused physical exam at this time.   Diet Order:   Diet Order            Diet Heart Fluid consistency: Thin  Diet effective ____             EDUCATION NEEDS:   No education needs have been identified at this time  Skin:  Skin Assessment: Reviewed RN Assessment(Ecchymosis)  Last BM:  PTA  Height:   Ht Readings from Last 1 Encounters:  08/24/19 6' (1.829 m)    Weight:   Wt Readings from Last 1 Encounters:  10/14/15 123.1 kg    Ideal Body Weight:  80.9 kg  BMI:  Body mass index is 36.81 kg/m.  Estimated Nutritional Needs:   Kcal:  2200-2500kcal/day  Protein:  110-125g/day  Fluid:  >2.4L/day  Koleen Distance MS, RD, LDN Pager #- [phone] Office#- [phone] After Hours Pager: [phone]

## 2019-08-24 NOTE — Evaluation (Signed)
Physical Therapy Evaluation Patient Details Name: Anthony Harrington MRN: [id number] DOB: Mar 01, 1966 Today's Date: 08/24/2019   History of Present Illness  Anthony Harrington is a 53 y.o. male with medical history significant of seizures; HTN; HLD; and tobacco dependence presenting with slurred speech and facial droop. CT reveals acute CVA in posterior limb of left internal capsule.   Clinical Impression  Pt admitted with above. Prior to admission, pt lives with his partner and is a Merchandiser, retail at [employer]. Pt presents with left gaze preference, right inattention, slurred speech and decreased cognition. Ambulating hallway distances with no assistive device without physical assist; negotiated 10 steps with min guard assist. Needs cues for right attention, but functionally not running into obstacles on right side. Would benefit from OPPT to address deficits.     Follow Up Recommendations Outpatient PT;Supervision/Assistance - 24 hour    Equipment Recommendations  None recommended by PT    Recommendations for Other Services       Precautions / Restrictions Precautions Precautions: Fall Restrictions Weight Bearing Restrictions: No      Mobility  Bed Mobility Overal bed mobility: Needs Assistance Bed Mobility: Sit to Supine      Sit to supine: Independent     Transfers Overall transfer level: Independent Equipment used: None Transfers: Sit to/from Stand           Ambulation/Gait Ambulation/Gait assistance: Chief Operating Officer (Feet): 400 Feet Assistive device: None Gait Pattern/deviations: WFL(Within Functional Limits)     General Gait Details: No apparent gait abnormalities, adequate speed, no overt LOB, decreased reciprocal arm swing  Stairs Stairs: Yes Stairs assistance: Min guard Stair Management: One rail Left Number of Stairs: 10 General stair comments: cues for step by step with descending  Wheelchair Mobility    Modified Rankin (Stroke Patients  Only) Modified Rankin (Stroke Patients Only) Pre-Morbid Rankin Score: No symptoms Modified Rankin: Moderately severe disability     Balance Overall balance assessment: Mild deficits observed, not formally tested                                           Pertinent Vitals/Pain Pain Assessment: No/denies pain    Home Living Family/patient expects to be discharged to:: Private residence Living Arrangements: Spouse/significant other Available Help at Discharge: Family;Available PRN/intermittently Type of Home: House Home Access: Stairs to enter Entrance Stairs-Rails: Right Entrance Stairs-Number of Steps: 3 Home Layout: One level Home Equipment: None      Prior Function Level of Independence: Independent         Comments: Pt was independent for ADLs, IADLs, and driving. Pt works as a Merchandiser, retail at [employer]   Dominant Hand: Right    Extremity/Trunk Assessment   Upper Extremity Assessment Upper Extremity Assessment: Overall WFL for tasks assessed    Lower Extremity Assessment Lower Extremity Assessment: RLE deficits/detail;LLE deficits/detail RLE Deficits / Details: Strength 5/5 LLE Deficits / Details: Strength 5/5    Cervical / Trunk Assessment Cervical / Trunk Assessment: Normal  Communication   Communication: Expressive difficulties;Other (comment)(Slurred speech)  Cognition Arousal/Alertness: Awake/alert Behavior During Therapy: Flat affect Overall Cognitive Status: Impaired/Different from baseline Area of Impairment: Attention;Memory;Following commands;Safety/judgement;Awareness;Problem solving                   Current Attention Level: Sustained Memory: Decreased short-term memory;Decreased recall of precautions Following Commands: Follows one step  commands inconsistently;Follows one step commands with increased time Safety/Judgement: Decreased awareness of safety;Decreased awareness of  deficits Awareness: Intellectual Problem Solving: Slow processing;Decreased initiation;Difficulty sequencing;Requires verbal cues;Requires tactile cues General Comments: Pt with decreased awareness of deficits; denying changes from baseline.        General Comments General comments (skin integrity, edema, etc.): Pt with slurred speech throughout session    Exercises     Assessment/Plan    PT Assessment Patient needs continued PT services  PT Problem List Decreased balance;Decreased mobility;Decreased safety awareness;Decreased cognition       PT Treatment Interventions Gait training;Stair training;Functional mobility training;Therapeutic activities;Therapeutic exercise;Balance training;Patient/family education    PT Goals (Current goals can be found in the Care Plan section)  Acute Rehab PT Goals Patient Stated Goal: go home PT Goal Formulation: With patient Time For Goal Achievement: 09/07/19 Potential to Achieve Goals: Good    Frequency Min 4X/week   Barriers to discharge        Co-evaluation               AM-PAC PT "6 Clicks" Mobility  Outcome Measure Help needed turning from your back to your side while in a flat bed without using bedrails?: None Help needed moving from lying on your back to sitting on the side of a flat bed without using bedrails?: None Help needed moving to and from a bed to a chair (including a wheelchair)?: None Help needed standing up from a chair using your arms (e.g., wheelchair or bedside chair)?: None Help needed to walk in hospital room?: None Help needed climbing 3-5 steps with a railing? : A Little 6 Click Score: 23    End of Session Equipment Utilized During Treatment: Gait belt Activity Tolerance: Patient tolerated treatment well Patient left: in bed;with call bell/phone within reach;with bed alarm set Nurse Communication: Mobility status PT Visit Diagnosis: Other symptoms and signs involving the nervous system (R29.898)     Time: 1517-6160 PT Time Calculation (min) (ACUTE ONLY): 13 min   Charges:   PT Evaluation $PT Eval Moderate Complexity: 1 Mod          Ellamae Sia, PT, DPT Acute Rehabilitation Services Pager [phone] Office [phone]   Willy Eddy 08/24/2019, 2:22 PM

## 2019-08-24 NOTE — Progress Notes (Signed)
Occupational Therapy Evaluation Patient Details Name: Anthony Harrington MRN: [id number] DOB: 21-Nov-1965 Today's Date: 08/24/2019    History of Present Illness Anthony Harrington is a 53 y.o. male with medical history significant of seizures; HTN; HLD; and tobacco dependence presenting with slurred speech and facial droop. CT reveals acute CVA in posterior limb of left internal capsule.    Clinical Impression   PTA, pt was living with partner in one level home, and worked as a Merchandiser, retailsupervisor at [employer]; reports independence with ADLs, IADLs, and driving. Pt currently presents with R inattention, decreased balance, strength, activity tolerance; as well as poor attention, STM, following simple commands, and problem solving. Pt seen with R inattention as seen by not donning/doffing gown on right arm and not donning R sock. Pt performed UB ADLs, LB ADls, and functional mobility with min guard A for safety and balance. Pt would benefit from continued OT acutely to address deficits and facilitate safe dc. Recommend dc home with 24 supervision and Neuro Outpatient OT to address cognitive deficits and increase awareness and safety.     Follow Up Recommendations  Outpatient OT;Supervision/Assistance - 24 hour(Outpatient Neuro OT)    Equipment Recommendations  3 in 1 bedside commode    Recommendations for Other Services PT consult     Precautions / Restrictions Precautions Precautions: Fall Restrictions Weight Bearing Restrictions: No      Mobility Bed Mobility Overal bed mobility: Needs Assistance Bed Mobility: Supine to Sit     Supine to sit: Min guard     General bed mobility comments: min guard A for safety  Transfers Overall transfer level: Needs assistance Equipment used: None Transfers: Sit to/from Stand Sit to Stand: Min guard         General transfer comment: min guard A for safety    Balance Overall balance assessment: Mild deficits observed, not formally tested                                          ADL either performed or assessed with clinical judgement   ADL Overall ADL's : Needs assistance/impaired     Grooming: Oral care;Min guard;Standing Grooming Details (indicate cue type and reason): Pt performed oral care standing at sink with min guard A for safety and balance. Pt required increased cues to recall goal of oral care, and required min VCs to initiate and sequence through task.  Upper Body Bathing: Supervision/ safety;Sitting   Lower Body Bathing: Min guard;Sit to/from stand   Upper Body Dressing : Sitting;Min guard Upper Body Dressing Details (indicate cue type and reason): Pt required min guard A and mod VCs for donning clean gown. Pt with R inattention, required cues to doff/don gown on R arm.  Lower Body Dressing: Min guard;Sit to/from stand Lower Body Dressing Details (indicate cue type and reason): Pt required min guard A and increased time. Pt with R inattention, required increased cues for donning sock on R side.  Toilet Transfer: Min guard;Ambulation(simulated to recliner) Toilet Transfer Details (indicate cue type and reason): Pt required min guard A for safety and balance Toileting- Clothing Manipulation and Hygiene: Min guard;Sit to/from stand       Functional mobility during ADLs: Min guard General ADL Comments: Focused session on changing into a clean gown and performing oral care task at sink. Pt with R inattention, requiring increased cues throughout. Pt required min guard A for  UB and LB dressing, and mod VCs to attend to R side. Pt required increased cues to walk to sink. Pt performed oral care at sink with min guard A for safety.      Vision Baseline Vision/History: No visual deficits Patient Visual Report: No change from baseline Vision Assessment?: No apparent visual deficits     Perception     Praxis      Pertinent Vitals/Pain Pain Assessment: No/denies pain     Hand Dominance Right    Extremity/Trunk Assessment Upper Extremity Assessment Upper Extremity Assessment: Generalized weakness   Lower Extremity Assessment Lower Extremity Assessment: Defer to PT evaluation   Cervical / Trunk Assessment Cervical / Trunk Assessment: Normal   Communication Communication Communication: Expressive difficulties;Other (comment)(Slurred speech)   Cognition Arousal/Alertness: Awake/alert Behavior During Therapy: Flat affect Overall Cognitive Status: Impaired/Different from baseline Area of Impairment: Attention;Memory;Following commands;Safety/judgement;Awareness;Problem solving                   Current Attention Level: Sustained Memory: Decreased short-term memory;Decreased recall of precautions Following Commands: Follows one step commands inconsistently;Follows one step commands with increased time Safety/Judgement: Decreased awareness of safety;Decreased awareness of deficits Awareness: Intellectual Problem Solving: Slow processing;Decreased initiation;Difficulty sequencing;Requires verbal cues;Requires tactile cues General Comments: Pt presented with slurred speech throughout session. Pt has slow processing and sequencing, requiring increased time and effort to follow simple commands. Pt with poor STM as seen by not recalling where he was going ~1 minute after being told to go to the sink. Pt has poor awareness as seen by the pt reporting that he had no deficits. Pt also showed R inattention throughout session as seen by not donning/doffing gown on R side, requiring increased cues to don sock on R side.    General Comments  Pt with slurred speech throughout session    Exercises     Shoulder Instructions      Home Living Family/patient expects to be discharged to:: Private residence Living Arrangements: Spouse/significant other Available Help at Discharge: Family;Available PRN/intermittently Type of Home: House Home Access: Stairs to enter [employer]  of Steps: 3 Entrance Stairs-Rails: Right Home Layout: One level     Bathroom Shower/Tub: Occupational psychologist: Standard     Home Equipment: None          Prior Functioning/Environment Level of Independence: Independent        Comments: Pt was independent for ADLs, IADLs, and driving. Pt works as a Librarian, academic at [employer] List: Decreased strength;Decreased range of motion;Decreased activity tolerance;Impaired balance (sitting and/or standing);Decreased cognition;Decreased safety awareness;Decreased knowledge of precautions;Other (comment)(R inattention)      OT Treatment/Interventions: Self-care/ADL training;DME and/or AE instruction;Therapeutic activities;Cognitive remediation/compensation;Patient/family education;Balance training    OT Goals(Current goals can be found in the care plan section) Acute Rehab OT Goals Patient Stated Goal: go home OT Goal Formulation: With patient Time For Goal Achievement: 09/07/19 Potential to Achieve Goals: Good  OT Frequency: Min 3X/week   Barriers to D/C:            Co-evaluation              AM-PAC OT "6 Clicks" Daily Activity     Outcome Measure Help from another person eating meals?: None Help from another person taking care of personal grooming?: A Little Help from another person toileting, which includes using toliet, bedpan, or urinal?: A Little Help from another person bathing (including washing, rinsing, drying)?: A  Little Help from another person to put on and taking off regular upper body clothing?: A Little Help from another person to put on and taking off regular lower body clothing?: A Little 6 Click Score: 19   End of Session Equipment Utilized During Treatment: Gait belt Nurse Communication: Mobility status  Activity Tolerance: Patient tolerated treatment well Patient left: Other (comment)(dovetail with PT)  OT Visit Diagnosis: Unsteadiness on feet (R26.81);Other  abnormalities of gait and mobility (R26.89);Muscle weakness (generalized) (M62.81);Other symptoms and signs involving cognitive function                Time: 1610-9604 OT Time Calculation (min): 16 min Charges:  OT General Charges $OT Visit: 1 Visit OT Evaluation $OT Eval Moderate Complexity: 1 Mod  Sandrea Hammond, OT Student  Sandrea Hammond 08/24/2019, 12:46 PM

## 2019-08-24 NOTE — Progress Notes (Signed)
Patient ID: Anthony Harrington, male   DOB: Sep 24, 1966, 53 y.o.   MRN: [id number]  PROGRESS NOTE    Anthony Harrington  [id number] DOB: Sep 24, 1966 DOA: 08/23/2019 PCP: Myrlene Brokerrawford, Elizabeth A, MD   Brief Narrative:  53 year old male with history of seizures, hypertension, hyperlipidemia and tobacco abuse presented with slurred speech and facial droop.  CT of the brain showed CVA.  Neurology was consulted.  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 -CT confirms CVA.  MRI of the brain showed acute infarct within the left thalamocapsular junction.  MRA head showed no intracranial large vessel occlusion or proximal high-grade arterial stenosis.  LDL 175 hemoglobin A1c 5.8.  Triglycerides 225 -Echo showed EF of 50 to 55%.  Continue aspirin and Lipitor -PT/OT/SLP evaluation  Hypertension--blood pressure on the high side.  Patient is out of the window for permissive hypertension.  Continue hydrochlorothiazide,  losartan and metoprolol  Hyperlipidemia--continue Lipitor  Alcohol abuse Tobacco dependence -Counseled regarding cessation.  CIWA protocol.  Continue thiamine, multivitamin and folic acid.  Obesity -Outpatient follow-up  DVT prophylaxis: Lovenox Code Status: Full Family Communication: Spoke to patient at bedside  disposition Plan: Home in 1 to 2 days once clinically improved and cleared by neurology and PT  Consultants: Neurology  Procedures:  Echo  1. Left ventricular ejection fraction, by visual estimation, is 50 to 55%. The left ventricle has low normal function. There is no left ventricular hypertrophy.  2. Definity contrast agent was given IV to delineate the left ventricular endocardial borders.  3. The left ventricle has no regional wall motion abnormalities.  4. Global right ventricle has normal systolic function.The right ventricular size is normal. No increase in right ventricular wall thickness.  5. Left atrial size  was mildly dilated.  6. Right atrial size was not well visualized.  7. Small pericardial effusion.  8. The mitral valve is normal in structure. No evidence of mitral valve regurgitation. No evidence of mitral stenosis.  9. The tricuspid valve is normal in structure. Tricuspid valve regurgitation is not demonstrated. 10. The aortic valve is tricuspid. Aortic valve regurgitation is not visualized. No evidence of aortic valve sclerosis or stenosis. 11. The pulmonic valve was normal in structure. Pulmonic valve regurgitation is not visualized. 12. Technically challenging study. Grossly normal LVEF, echo contrast does not demonstrate focal wall motion abnormalities. Reduced sensitivity for detection based on limited windows. No clear cardiac source of emboli.  Antimicrobials: None   Subjective: Patient seen and examined at bedside.  He thinks that his symptoms have not improved yet.  Still feels that his speech slurring.  No overnight fever, vomiting or worsening weakness noted.  Objective: Vitals:   08/24/19 0039 08/24/19 0300 08/24/19 0454 08/24/19 0834  BP: (!) 180/168  (!) 160/94 (!) 164/113  Pulse: 68  87 75  Resp: 13  (!) 21 15  Temp:  98.4 F (36.9 C)  99.2 F (37.3 C)  TempSrc:  Oral  Oral  SpO2: 98%  93% 98%    Intake/Output Summary (Last 24 hours) at 08/24/2019 0951 Last data filed at 08/24/2019 0700 Gross per 24 hour  Intake 732.77 ml  Output 400 ml  Net 332.77 ml   There were no vitals filed for this visit.  Examination:  General exam: Appears calm and comfortable.  No distress Respiratory system: Bilateral decreased breath sounds at bases Cardiovascular system: S1 & S2 heard, Rate controlled Gastrointestinal system: Abdomen is nondistended, soft and nontender. Normal  bowel sounds heard. Extremities: No cyanosis, clubbing, edema  Central nervous system: Awake but has some slurred speech. No focal neurological deficits. Moving extremities   Data Reviewed: I have  personally reviewed following labs and imaging studies  CBC: Recent Labs  Lab 08/23/19 1028 08/23/19 1038  WBC 8.4  --   NEUTROABS 5.6  --   HGB 17.1* 17.7*  HCT 50.0 52.0  MCV 99.4  --   PLT 218  --    Basic Metabolic Panel: Recent Labs  Lab 08/23/19 1028 08/23/19 1038  NA 139 138  K 3.9 3.8  CL 104 104  CO2 20*  --   GLUCOSE 148* 149*  BUN 22* 26*  CREATININE 0.79 0.70  CALCIUM 10.0  --    GFR: CrCl cannot be calculated (Unknown ideal weight.). Liver Function Tests: Recent Labs  Lab 08/23/19 1028  AST 46*  ALT 69*  ALKPHOS 75  BILITOT 1.1  PROT 7.6  ALBUMIN 4.8   No results for input(s): LIPASE, AMYLASE in the last 168 hours. No results for input(s): AMMONIA in the last 168 hours. Coagulation Profile: Recent Labs  Lab 08/23/19 1028  INR 1.0   Cardiac Enzymes: Recent Labs  Lab 08/23/19 1414  CKTOTAL 143   BNP (last 3 results) No results for input(s): PROBNP in the last 8760 hours. HbA1C: Recent Labs    08/24/19 0214  HGBA1C 5.8*   CBG: No results for input(s): GLUCAP in the last 168 hours. Lipid Profile: Recent Labs    08/24/19 0214  CHOL 258*  HDL 38*  LDLCALC 175*  TRIG 225*  CHOLHDL 6.8   Thyroid Function Tests: Recent Labs    08/23/19 1414  TSH 1.492   Anemia Panel: No results for input(s): VITAMINB12, FOLATE, FERRITIN, TIBC, IRON, RETICCTPCT in the last 72 hours. Sepsis Labs: No results for input(s): PROCALCITON, LATICACIDVEN in the last 168 hours.  Recent Results (from the past 240 hour(s))  SARS CORONAVIRUS 2 (TAT 6-24 HRS) Nasopharyngeal Nasopharyngeal Swab     Status: None   Collection Time: 08/23/19 11:18 AM   Specimen: Nasopharyngeal Swab  Result Value Ref Range Status   SARS Coronavirus 2 NEGATIVE NEGATIVE Final    Comment: (NOTE) SARS-CoV-2 target nucleic acids are NOT DETECTED. The SARS-CoV-2 RNA is generally detectable in upper and lower respiratory specimens during the acute phase of infection. Negative  results do not preclude SARS-CoV-2 infection, do not rule out co-infections with other pathogens, and should not be used as the sole basis for treatment or other patient management decisions. Negative results must be combined with clinical observations, patient history, and epidemiological information. The expected result is Negative. Fact Sheet for Patients: HairSlick.no Fact Sheet for Healthcare Providers: quierodirigir.com This test is not yet approved or cleared by the [place] FDA and  has been authorized for detection and/or diagnosis of SARS-CoV-2 by FDA under an Emergency Use Authorization (EUA). This EUA will remain  in effect (meaning this test can be used) for the duration of the COVID-19 declaration under Section 56 4(b)(1) of the Act, 21 U.S.C. section 360bbb-3(b)(1), unless the authorization is terminated or revoked sooner. Performed at [facility], [address] [place], [place] [address]          Radiology Studies: Ct Head Wo Contrast  Result Date: 08/23/2019 CLINICAL DATA:  Slurred speech with right facial droop EXAM: CT HEAD WITHOUT CONTRAST TECHNIQUE: Contiguous axial images were obtained from the base of the skull through the vertex without intravenous contrast.  COMPARISON:  None. FINDINGS: Brain: Ventricles and sulci are within normal limits for age. There is no evident mass, hemorrhage, extra-axial fluid collection, or midline shift. There is an acute appearing infarct involving the genu and much of the posterior limb of the left internal capsule with involvement of the medial posterior lentiform nucleus on the left as well. There is evidence of a punctate area of decreased attenuation in the right thalamus, probably indicative of a prior small infarct in this area. Elsewhere, the brain parenchyma appears unremarkable. Vascular: There is no hyperdense vessel. There is no appreciable vascular  calcification. Skull: The bony calvarium appears intact. Sinuses/Orbits: There is a retention cyst in the inferior right maxillary antrum. There is mucosal thickening and opacification in multiple ethmoid air cells as well as in the inferior right frontal sinus. There is a retention cyst in the posterior right sphenoid sinus with mucosal thickening in the anterior right sphenoid sinus. Orbits appear symmetric bilaterally. Other: Mastoid air cells are clear. IMPRESSION: 1. Acute appearing infarct involving the genu and much of the posterior limb of the left internal capsule. There appears to be involvement of the immediately adjacent medial aspect of the posterior left lentiform nucleus. No other acute infarct evident. 2.  Likely prior tiny infarct in the right thalamus. 3.  No mass or hemorrhage. 4.  Multifocal paranasal sinus disease. Electronically Signed   By: Bretta Bang III [name]   On: 08/23/2019 12:29   Mr Angio Head Wo Contrast  Result Date: 08/23/2019 CLINICAL DATA:  Stroke, follow-up. Additional history provided: History of seizures, presenting with slurred speech and facial droop. EXAM: MRI HEAD WITHOUT CONTRAST MRA HEAD WITHOUT CONTRAST TECHNIQUE: Multiplanar, multiecho pulse sequences of the brain and surrounding structures were obtained without intravenous contrast. Angiographic images of the head were obtained using MRA technique without contrast. COMPARISON:  Head CT 08/31/2019 FINDINGS: MRI HEAD FINDINGS Brain: There is an acute infarct centered within the left thalamocapsular junction, also involving portions of the left internal capsule genu and left lentiform nucleus. The infarct measures 2.6 x 0.9 x 1.8 cm (AP x TV x CC). Corresponding T2/FLAIR hyperintensity at this site. No evidence of intracranial mass. No midline shift or extra-axial fluid collection. No chronic intracranial blood products. Small chronic lacunar infarcts within the right thalamus, right pons and left cerebellum.  There is a background of mild chronic small vessel ischemic disease. Cerebral volume is normal for age. Vascular: Reported separately Skull and upper cervical spine: No focal marrow lesion. Sinuses/Orbits: Visualized orbits demonstrate no acute abnormality. Mild scattered paranasal sinus mucosal thickening. Moderate-sized right maxillary sinus mucous retention cyst. No significant mastoid effusion. MRA HEAD FINDINGS The intracranial internal carotid arteries are patent with only mild atherosclerotic irregularity. The M1 right middle cerebral artery is patent without significant stenosis. The M2 right MCA branches are patent without proximal branch occlusion. The right anterior cerebral artery is patent without significant proximal stenosis. The M1 left middle cerebral artery is patent without significant stenosis. The M2 left MCA branches are patent without proximal branch occlusion. The left anterior cerebral artery is patent without significant proximal stenosis No intracranial aneurysm is identified. Dominant intracranial right vertebral artery without significant stenosis. Non dominant intracranial left vertebral artery with a mild focal stenosis beyond the origin of the left PICA. The basilar artery is patent without significant stenosis. The bilateral posterior cerebral arteries are patent without significant proximal stenosis. Posterior communicating arteries are poorly delineated and may be hypoplastic or absent bilaterally. IMPRESSION: MRI brain: 1.  2.6 x 0.9 x 1.8 cm acute infarct centered within the left thalamocapsular junction, also involving portions of the left internal capsule genu and left lentiform nucleus. 2. Background of mild chronic small vessel ischemic disease with several small chronic lacunar infarcts. MRA head: 1. No intracranial large vessel occlusion or proximal high-grade arterial stenosis. 2. Mild atherosclerotic irregularity of the intracranial internal carotid arteries. 3. Mild focal  stenosis within the non dominant left vertebral artery beyond the left PICA origin. Electronically Signed   By: Kellie Simmering DO   On: 08/23/2019 16:37   Mr Brain Wo Contrast  Result Date: 08/23/2019 CLINICAL DATA:  Stroke, follow-up. Additional history provided: History of seizures, presenting with slurred speech and facial droop. EXAM: MRI HEAD WITHOUT CONTRAST MRA HEAD WITHOUT CONTRAST TECHNIQUE: Multiplanar, multiecho pulse sequences of the brain and surrounding structures were obtained without intravenous contrast. Angiographic images of the head were obtained using MRA technique without contrast. COMPARISON:  Head CT 08/31/2019 FINDINGS: MRI HEAD FINDINGS Brain: There is an acute infarct centered within the left thalamocapsular junction, also involving portions of the left internal capsule genu and left lentiform nucleus. The infarct measures 2.6 x 0.9 x 1.8 cm (AP x TV x CC). Corresponding T2/FLAIR hyperintensity at this site. No evidence of intracranial mass. No midline shift or extra-axial fluid collection. No chronic intracranial blood products. Small chronic lacunar infarcts within the right thalamus, right pons and left cerebellum. There is a background of mild chronic small vessel ischemic disease. Cerebral volume is normal for age. Vascular: Reported separately Skull and upper cervical spine: No focal marrow lesion. Sinuses/Orbits: Visualized orbits demonstrate no acute abnormality. Mild scattered paranasal sinus mucosal thickening. Moderate-sized right maxillary sinus mucous retention cyst. No significant mastoid effusion. MRA HEAD FINDINGS The intracranial internal carotid arteries are patent with only mild atherosclerotic irregularity. The M1 right middle cerebral artery is patent without significant stenosis. The M2 right MCA branches are patent without proximal branch occlusion. The right anterior cerebral artery is patent without significant proximal stenosis. The M1 left middle cerebral  artery is patent without significant stenosis. The M2 left MCA branches are patent without proximal branch occlusion. The left anterior cerebral artery is patent without significant proximal stenosis No intracranial aneurysm is identified. Dominant intracranial right vertebral artery without significant stenosis. Non dominant intracranial left vertebral artery with a mild focal stenosis beyond the origin of the left PICA. The basilar artery is patent without significant stenosis. The bilateral posterior cerebral arteries are patent without significant proximal stenosis. Posterior communicating arteries are poorly delineated and may be hypoplastic or absent bilaterally. IMPRESSION: MRI brain: 1. 2.6 x 0.9 x 1.8 cm acute infarct centered within the left thalamocapsular junction, also involving portions of the left internal capsule genu and left lentiform nucleus. 2. Background of mild chronic small vessel ischemic disease with several small chronic lacunar infarcts. MRA head: 1. No intracranial large vessel occlusion or proximal high-grade arterial stenosis. 2. Mild atherosclerotic irregularity of the intracranial internal carotid arteries. 3. Mild focal stenosis within the non dominant left vertebral artery beyond the left PICA origin. Electronically Signed   By: Kellie Simmering DO   On: 08/23/2019 16:37        Scheduled Meds: . aspirin  300 mg Rectal Daily   Or  . aspirin  325 mg Oral Daily  . atorvastatin  40 mg Oral q1800  . enoxaparin (LOVENOX) injection  40 mg Subcutaneous Q24H  . folic acid  1 mg Oral Daily  . losartan  100 mg Oral Daily   And  . hydrochlorothiazide  25 mg Oral Daily  . metoprolol tartrate  25 mg Oral BID  . multivitamin with minerals  1 tablet Oral Daily  . thiamine  100 mg Oral Daily   Or  . thiamine  100 mg Intravenous Daily   Continuous Infusions: . sodium chloride 50 mL/hr at 08/23/19 2106          Glade Lloyd, MD [facility] 08/24/2019, 9:51 AM

## 2019-08-25 MED ORDER — SENNOSIDES-DOCUSATE SODIUM 8.6-50 MG PO TABS: 1.0000 | ORAL_TABLET | Freq: Every evening | ORAL | 0 refills | Status: DC | PRN

## 2019-08-25 MED ORDER — ASPIRIN 325 MG PO TABS: 325.0000 mg | ORAL_TABLET | Freq: Every day | ORAL | 0 refills | Status: DC

## 2019-08-25 MED ORDER — FOLIC ACID 1 MG PO TABS: 1.0000 mg | ORAL_TABLET | Freq: Every day | ORAL | 0 refills | Status: DC

## 2019-08-25 MED ORDER — METOPROLOL TARTRATE 25 MG PO TABS: 25.0000 mg | ORAL_TABLET | Freq: Two times a day (BID) | ORAL | 0 refills | Status: DC

## 2019-08-25 MED ORDER — LOSARTAN POTASSIUM-HCTZ 100-25 MG PO TABS: 1.0000 | ORAL_TABLET | Freq: Every day | ORAL | 0 refills | Status: DC

## 2019-08-25 MED ORDER — THIAMINE HCL 100 MG PO TABS: 100.0000 mg | ORAL_TABLET | Freq: Every day | ORAL | 0 refills | Status: DC

## 2019-08-25 MED ORDER — ATORVASTATIN CALCIUM 40 MG PO TABS: 40.0000 mg | ORAL_TABLET | Freq: Every day | ORAL | 0 refills | Status: DC

## 2019-08-25 MED ORDER — ADULT MULTIVITAMIN W/MINERALS CH: 1.0000 | ORAL_TABLET | Freq: Every day | ORAL | Status: DC

## 2019-08-25 NOTE — Discharge Summary (Signed)
Physician Discharge Summary  Anthony Harrington ZOX:[id number] DOB: 1965-12-14 DOA: 08/23/2019  PCP: Myrlene Broker, MD  Admit date: 08/23/2019 Discharge date: 08/25/2019  Admitted From: Home Disposition: Home  Recommendations for Outpatient Follow-up:  1. Follow up with PCP in 1 week with repeat CBC/BMP 2. Outpatient follow-up with neurology 3. Follow up in ED if symptoms worsen or new appear   Home Health: PT/OT/SLP  equipment/Devices: None  Discharge Condition: Stable CODE STATUS: Full Diet recommendation: Heart healthy/diet as per SLP recommendations  Brief/Interim Summary: 53 year old male with history of seizures, hypertension, hyperlipidemia and tobacco abuse presented with slurred speech and facial droop.  CT of the brain showed CVA.  Neurology was consulted.  He was started on aspirin and Lipitor.  He has tolerated PT/OT.  Diet as per SLP recommendations.  Neurology has cleared the patient for discharge.  Discharge patient home today.  Outpatient follow-up with neurology.  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 -CT confirms CVA.  MRI of the brain showed acute infarct within the left thalamocapsular junction.  MRA head showed no intracranial large vessel occlusion or proximal high-grade arterial stenosis.  LDL 175 hemoglobin A1c 5.8.  Triglycerides 225 -Echo showed EF of 50 to 55%.  Continue aspirin and Lipitor -He has tolerated PT/OT.  Diet as per SLP recommendations.  Patient will need outpatient PT/OT/SLP -Neurology has cleared the patient for discharge.  Discharge patient home today on aspirin and Lipitor.  Outpatient follow-up with neurology.   Hypertension--blood pressure on the high side.  Patient is out of the window for permissive hypertension.  Continue hydrochlorothiazide,  losartan and metoprolol.  Comply with medications.  Outpatient follow-up.  Hyperlipidemia--continue Lipitor  Alcohol  abuse Tobacco dependence -Counseled regarding cessation.    Treated with CIWA protocol.    No signs of withdrawal.  Continue thiamine, multivitamin and folic acid on discharge.  Obesity -Outpatient follow-up  Discharge Instructions  Discharge Instructions    Ambulatory referral to Neurology   Complete by: As directed    An appointment is requested in approximately: few weeks   Diet - low sodium heart healthy   Complete by: As directed    Increase activity slowly   Complete by: As directed      Allergies as of 08/25/2019      Reactions   Lisinopril Rash      Medication List    STOP taking these medications   HYDROcodone-acetaminophen 10-325 MG tablet Commonly known as: NORCO     TAKE these medications   aspirin 325 MG tablet Take 1 tablet (325 mg total) by mouth daily. Start taking on: August 26, 2019   atorvastatin 40 MG tablet Commonly known as: LIPITOR Take 1 tablet (40 mg total) by mouth daily at 6 PM.   fluticasone 50 MCG/ACT nasal spray Commonly known as: FLONASE Place 2 sprays into both nostrils daily.   folic acid 1 MG tablet Commonly known as: FOLVITE Take 1 tablet (1 mg total) by mouth daily. Start taking on: August 26, 2019   losartan-hydrochlorothiazide 100-25 MG tablet Commonly known as: HYZAAR Take 1 tablet by mouth daily. NEEDS ANNUAL VISIT FOR FURTHER REFILLS   metoprolol tartrate 25 MG tablet Commonly known as: LOPRESSOR Take 1 tablet (25 mg total) by mouth 2 (two) times daily.   multivitamin with minerals Tabs tablet Take 1 tablet by mouth daily. Start taking on: August 26, 2019   senna-docusate 8.6-50 MG tablet Commonly known as: Senokot-S Take 1  tablet by mouth at bedtime as needed for mild constipation.   thiamine 100 MG tablet Take 1 tablet (100 mg total) by mouth daily. Start taking on: August 26, 2019      Follow-up Information    Myrlene Broker, MD. Schedule an appointment as soon as possible for a visit  in 1 week(s).   Specialty: Internal Medicine Why: With repeat CBC/BMP Contact information: [address] [place] [address] [phone]          Allergies  Allergen Reactions  . Lisinopril Rash    Consultations:  Neurology   Procedures/Studies: Ct Head Wo Contrast  Result Date: 08/23/2019 CLINICAL DATA:  Slurred speech with right facial droop EXAM: CT HEAD WITHOUT CONTRAST TECHNIQUE: Contiguous axial images were obtained from the base of the skull through the vertex without intravenous contrast. COMPARISON:  None. FINDINGS: Brain: Ventricles and sulci are within normal limits for age. There is no evident mass, hemorrhage, extra-axial fluid collection, or midline shift. There is an acute appearing infarct involving the genu and much of the posterior limb of the left internal capsule with involvement of the medial posterior lentiform nucleus on the left as well. There is evidence of a punctate area of decreased attenuation in the right thalamus, probably indicative of a prior small infarct in this area. Elsewhere, the brain parenchyma appears unremarkable. Vascular: There is no hyperdense vessel. There is no appreciable vascular calcification. Skull: The bony calvarium appears intact. Sinuses/Orbits: There is a retention cyst in the inferior right maxillary antrum. There is mucosal thickening and opacification in multiple ethmoid air cells as well as in the inferior right frontal sinus. There is a retention cyst in the posterior right sphenoid sinus with mucosal thickening in the anterior right sphenoid sinus. Orbits appear symmetric bilaterally. Other: Mastoid air cells are clear. IMPRESSION: 1. Acute appearing infarct involving the genu and much of the posterior limb of the left internal capsule. There appears to be involvement of the immediately adjacent medial aspect of the posterior left lentiform nucleus. No other acute infarct evident. 2.  Likely prior tiny infarct in the  right thalamus. 3.  No mass or hemorrhage. 4.  Multifocal paranasal sinus disease. Electronically Signed   By: Bretta Bang III [name]   On: 08/23/2019 12:29   Mr Angio Head Wo Contrast  Result Date: 08/23/2019 CLINICAL DATA:  Stroke, follow-up. Additional history provided: History of seizures, presenting with slurred speech and facial droop. EXAM: MRI HEAD WITHOUT CONTRAST MRA HEAD WITHOUT CONTRAST TECHNIQUE: Multiplanar, multiecho pulse sequences of the brain and surrounding structures were obtained without intravenous contrast. Angiographic images of the head were obtained using MRA technique without contrast. COMPARISON:  Head CT 08/31/2019 FINDINGS: MRI HEAD FINDINGS Brain: There is an acute infarct centered within the left thalamocapsular junction, also involving portions of the left internal capsule genu and left lentiform nucleus. The infarct measures 2.6 x 0.9 x 1.8 cm (AP x TV x CC). Corresponding T2/FLAIR hyperintensity at this site. No evidence of intracranial mass. No midline shift or extra-axial fluid collection. No chronic intracranial blood products. Small chronic lacunar infarcts within the right thalamus, right pons and left cerebellum. There is a background of mild chronic small vessel ischemic disease. Cerebral volume is normal for age. Vascular: Reported separately Skull and upper cervical spine: No focal marrow lesion. Sinuses/Orbits: Visualized orbits demonstrate no acute abnormality. Mild scattered paranasal sinus mucosal thickening. Moderate-sized right maxillary sinus mucous retention cyst. No significant mastoid effusion. MRA HEAD FINDINGS The intracranial  internal carotid arteries are patent with only mild atherosclerotic irregularity. The M1 right middle cerebral artery is patent without significant stenosis. The M2 right MCA branches are patent without proximal branch occlusion. The right anterior cerebral artery is patent without significant proximal stenosis. The M1 left  middle cerebral artery is patent without significant stenosis. The M2 left MCA branches are patent without proximal branch occlusion. The left anterior cerebral artery is patent without significant proximal stenosis No intracranial aneurysm is identified. Dominant intracranial right vertebral artery without significant stenosis. Non dominant intracranial left vertebral artery with a mild focal stenosis beyond the origin of the left PICA. The basilar artery is patent without significant stenosis. The bilateral posterior cerebral arteries are patent without significant proximal stenosis. Posterior communicating arteries are poorly delineated and may be hypoplastic or absent bilaterally. IMPRESSION: MRI brain: 1. 2.6 x 0.9 x 1.8 cm acute infarct centered within the left thalamocapsular junction, also involving portions of the left internal capsule genu and left lentiform nucleus. 2. Background of mild chronic small vessel ischemic disease with several small chronic lacunar infarcts. MRA head: 1. No intracranial large vessel occlusion or proximal high-grade arterial stenosis. 2. Mild atherosclerotic irregularity of the intracranial internal carotid arteries. 3. Mild focal stenosis within the non dominant left vertebral artery beyond the left PICA origin. Electronically Signed   By: Kellie Simmering DO   On: 08/23/2019 16:37   Mr Brain Wo Contrast  Result Date: 08/23/2019 CLINICAL DATA:  Stroke, follow-up. Additional history provided: History of seizures, presenting with slurred speech and facial droop. EXAM: MRI HEAD WITHOUT CONTRAST MRA HEAD WITHOUT CONTRAST TECHNIQUE: Multiplanar, multiecho pulse sequences of the brain and surrounding structures were obtained without intravenous contrast. Angiographic images of the head were obtained using MRA technique without contrast. COMPARISON:  Head CT 08/31/2019 FINDINGS: MRI HEAD FINDINGS Brain: There is an acute infarct centered within the left thalamocapsular junction, also  involving portions of the left internal capsule genu and left lentiform nucleus. The infarct measures 2.6 x 0.9 x 1.8 cm (AP x TV x CC). Corresponding T2/FLAIR hyperintensity at this site. No evidence of intracranial mass. No midline shift or extra-axial fluid collection. No chronic intracranial blood products. Small chronic lacunar infarcts within the right thalamus, right pons and left cerebellum. There is a background of mild chronic small vessel ischemic disease. Cerebral volume is normal for age. Vascular: Reported separately Skull and upper cervical spine: No focal marrow lesion. Sinuses/Orbits: Visualized orbits demonstrate no acute abnormality. Mild scattered paranasal sinus mucosal thickening. Moderate-sized right maxillary sinus mucous retention cyst. No significant mastoid effusion. MRA HEAD FINDINGS The intracranial internal carotid arteries are patent with only mild atherosclerotic irregularity. The M1 right middle cerebral artery is patent without significant stenosis. The M2 right MCA branches are patent without proximal branch occlusion. The right anterior cerebral artery is patent without significant proximal stenosis. The M1 left middle cerebral artery is patent without significant stenosis. The M2 left MCA branches are patent without proximal branch occlusion. The left anterior cerebral artery is patent without significant proximal stenosis No intracranial aneurysm is identified. Dominant intracranial right vertebral artery without significant stenosis. Non dominant intracranial left vertebral artery with a mild focal stenosis beyond the origin of the left PICA. The basilar artery is patent without significant stenosis. The bilateral posterior cerebral arteries are patent without significant proximal stenosis. Posterior communicating arteries are poorly delineated and may be hypoplastic or absent bilaterally. IMPRESSION: MRI brain: 1. 2.6 x 0.9 x 1.8 cm acute infarct centered within  the left  thalamocapsular junction, also involving portions of the left internal capsule genu and left lentiform nucleus. 2. Background of mild chronic small vessel ischemic disease with several small chronic lacunar infarcts. MRA head: 1. No intracranial large vessel occlusion or proximal high-grade arterial stenosis. 2. Mild atherosclerotic irregularity of the intracranial internal carotid arteries. 3. Mild focal stenosis within the non dominant left vertebral artery beyond the left PICA origin. Electronically Signed   By: Jackey Loge DO   On: 08/23/2019 16:37   Vas US Carotid  Result Date: 08/24/2019 Carotid Arterial Duplex Study Indications:       CVA. Risk Factors:      Hypertension, hyperlipidemia. Other Factors:     Infarct centered within the left                    thalamocapsular junction. Comparison Study:  no prior Performing Technologist: Jeb Levering RDMS, RVT  Examination Guidelines: A complete evaluation includes B-mode imaging, spectral Doppler, color Doppler, and power Doppler as needed of all accessible portions of each vessel. Bilateral testing is considered an integral part of a complete examination. Limited examinations for reoccurring indications may be performed as noted.  Right Carotid Findings: +----------+--------+--------+--------+------------------+------------------+           PSV cm/sEDV cm/sStenosisPlaque DescriptionComments           +----------+--------+--------+--------+------------------+------------------+ CCA Prox  84      21                                                   +----------+--------+--------+--------+------------------+------------------+ CCA Distal51      11              heterogenous                         +----------+--------+--------+--------+------------------+------------------+ ICA Prox  44      14      1-39%                     intimal thickening +----------+--------+--------+--------+------------------+------------------+ ICA  Distal92      23                                                   +----------+--------+--------+--------+------------------+------------------+ ECA       57      17                                                   +----------+--------+--------+--------+------------------+------------------+ +----------+--------+-------+----------------+-------------------+           PSV cm/sEDV cmsDescribe        Arm Pressure (mmHG) +----------+--------+-------+----------------+-------------------+ [id number]             Multiphasic, WNL                    +----------+--------+-------+----------------+-------------------+ +---------+--------+--+--------+--+---------+ VertebralPSV cm/s42EDV cm/s13Antegrade +---------+--------+--+--------+--+---------+  Left Carotid Findings: +----------+--------+--------+--------+------------------+--------+           PSV cm/sEDV cm/sStenosisPlaque DescriptionComments +----------+--------+--------+--------+------------------+--------+ CCA Prox  81  8                                          +----------+--------+--------+--------+------------------+--------+ CCA Distal48      14                                         +----------+--------+--------+--------+------------------+--------+ ICA Prox  57      21      1-39%   heterogenous               +----------+--------+--------+--------+------------------+--------+ ICA Distal68      23                                         +----------+--------+--------+--------+------------------+--------+ ECA       34      8               heterogenous               +----------+--------+--------+--------+------------------+--------+ +----------+--------+--------+----------------+-------------------+           PSV cm/sEDV cm/sDescribe        Arm Pressure (mmHG) +----------+--------+--------+----------------+-------------------+ Subclavian70              Multiphasic, WNL                     +----------+--------+--------+----------------+-------------------+ +---------+--------+--+--------+-+---------+ VertebralPSV cm/s29EDV cm/s7Antegrade +---------+--------+--+--------+-+---------+  Summary: Right Carotid: Velocities in the right ICA are consistent with a 1-39% stenosis. Left Carotid: Velocities in the left ICA are consistent with a 1-39% stenosis.  *See table(s) above for measurements and observations.     Preliminary     Echo 1. Left ventricular ejection fraction, by visual estimation, is 50 to 55%. The left ventricle has low normal function. There is no left ventricular hypertrophy. 2. Definity contrast agent was given IV to delineate the left ventricular endocardial borders. 3. The left ventricle has no regional wall motion abnormalities. 4. Global right ventricle has normal systolic function.The right ventricular size is normal. No increase in right ventricular wall thickness. 5. Left atrial size was mildly dilated. 6. Right atrial size was not well visualized. 7. Small pericardial effusion. 8. The mitral valve is normal in structure. No evidence of mitral valve regurgitation. No evidence of mitral stenosis. 9. The tricuspid valve is normal in structure. Tricuspid valve regurgitation is not demonstrated. 10. The aortic valve is tricuspid. Aortic valve regurgitation is not visualized. No evidence of aortic valve sclerosis or stenosis. 11. The pulmonic valve was normal in structure. Pulmonic valve regurgitation is not visualized. 12. Technically challenging study. Grossly normal LVEF, echo contrast does not demonstrate focal wall motion abnormalities. Reduced sensitivity for detection based on limited windows. No clear cardiac source of emboli.   Subjective: Patient seen and examined at bedside.  He feels that he is ready vomiting.  Denies worsening slurring of speech although still has some slurred speech.  No overnight fever or vomiting.  No worsening  weakness noted there  Discharge Exam: Vitals:   08/25/19 0350 08/25/19 0809  BP: (!) 146/99 (!) 152/97  Pulse:  69  Resp: 14   Temp: 98.5 F (36.9 C)   SpO2: 100%     General: Pt is  alert, awake, not in acute distress.  Still has some slight slurred speech. Cardiovascular: rate controlled, S1/S2 + Respiratory: bilateral decreased breath sounds at bases Abdominal: Soft, NT, ND, bowel sounds + Extremities: no edema, no cyanosis    The results of significant diagnostics from this hospitalization (including imaging, microbiology, ancillary and laboratory) are listed below for reference.     Microbiology: Recent Results (from the past 240 hour(s))  SARS CORONAVIRUS 2 (TAT 6-24 HRS) Nasopharyngeal Nasopharyngeal Swab     Status: None   Collection Time: 08/23/19 11:18 AM   Specimen: Nasopharyngeal Swab  Result Value Ref Range Status   SARS Coronavirus 2 NEGATIVE NEGATIVE Final    Comment: (NOTE) SARS-CoV-2 target nucleic acids are NOT DETECTED. The SARS-CoV-2 RNA is generally detectable in upper and lower respiratory specimens during the acute phase of infection. Negative results do not preclude SARS-CoV-2 infection, do not rule out co-infections with other pathogens, and should not be used as the sole basis for treatment or other patient management decisions. Negative results must be combined with clinical observations, patient history, and epidemiological information. The expected result is Negative. Fact Sheet for Patients: HairSlick.nohttps://www.fda.gov/media/138098/download Fact Sheet for Healthcare Providers: quierodirigir.comhttps://www.fda.gov/media/138095/download This test is not yet approved or cleared by the [place] FDA and  has been authorized for detection and/or diagnosis of SARS-CoV-2 by FDA under an Emergency Use Authorization (EUA). This EUA will remain  in effect (meaning this test can be used) for the duration of the COVID-19 declaration under Section 56 4(b)(1) of the Act,  21 U.S.C. section 360bbb-3(b)(1), unless the authorization is terminated or revoked sooner. Performed at [facility], [address] [place], [place] [address]      Labs: BNP (last 3 results) No results for input(s): BNP in the last 8760 hours. Basic Metabolic Panel: Recent Labs  Lab 08/23/19 1028 08/23/19 1038  NA 139 138  K 3.9 3.8  CL 104 104  CO2 20*  --   GLUCOSE 148* 149*  BUN 22* 26*  CREATININE 0.79 0.70  CALCIUM 10.0  --    Liver Function Tests: Recent Labs  Lab 08/23/19 1028  AST 46*  ALT 69*  ALKPHOS 75  BILITOT 1.1  PROT 7.6  ALBUMIN 4.8   No results for input(s): LIPASE, AMYLASE in the last 168 hours. No results for input(s): AMMONIA in the last 168 hours. CBC: Recent Labs  Lab 08/23/19 1028 08/23/19 1038  WBC 8.4  --   NEUTROABS 5.6  --   HGB 17.1* 17.7*  HCT 50.0 52.0  MCV 99.4  --   PLT 218  --    Cardiac Enzymes: Recent Labs  Lab 08/23/19 1414  CKTOTAL 143   BNP: Invalid input(s): POCBNP CBG: No results for input(s): GLUCAP in the last 168 hours. D-Dimer No results for input(s): DDIMER in the last 72 hours. Hgb A1c Recent Labs    08/24/19 0214  HGBA1C 5.8*   Lipid Profile Recent Labs    08/24/19 0214  CHOL 258*  HDL 38*  LDLCALC 175*  TRIG 225*  CHOLHDL 6.8   Thyroid function studies Recent Labs    08/23/19 1414  TSH 1.492   Anemia work up No results for input(s): VITAMINB12, FOLATE, FERRITIN, TIBC, IRON, RETICCTPCT in the last 72 hours. Urinalysis No results found for: COLORURINE, APPEARANCEUR, LABSPEC, PHURINE, GLUCOSEU, HGBUR, BILIRUBINUR, KETONESUR, PROTEINUR, UROBILINOGEN, NITRITE, LEUKOCYTESUR Sepsis Labs Invalid input(s): PROCALCITONIN,  WBC,  LACTICIDVEN Microbiology Recent Results (from the past 240 hour(s))  SARS CORONAVIRUS 2 (  TAT 6-24 HRS) Nasopharyngeal Nasopharyngeal Swab     Status: None   Collection Time: 08/23/19 11:18 AM   Specimen: Nasopharyngeal Swab  Result Value Ref Range  Status   SARS Coronavirus 2 NEGATIVE NEGATIVE Final    Comment: (NOTE) SARS-CoV-2 target nucleic acids are NOT DETECTED. The SARS-CoV-2 RNA is generally detectable in upper and lower respiratory specimens during the acute phase of infection. Negative results do not preclude SARS-CoV-2 infection, do not rule out co-infections with other pathogens, and should not be used as the sole basis for treatment or other patient management decisions. Negative results must be combined with clinical observations, patient history, and epidemiological information. The expected result is Negative. Fact Sheet for Patients: HairSlick.no Fact Sheet for Healthcare Providers: quierodirigir.com This test is not yet approved or cleared by the [place] FDA and  has been authorized for detection and/or diagnosis of SARS-CoV-2 by FDA under an Emergency Use Authorization (EUA). This EUA will remain  in effect (meaning this test can be used) for the duration of the COVID-19 declaration under Section 56 4(b)(1) of the Act, 21 U.S.C. section 360bbb-3(b)(1), unless the authorization is terminated or revoked sooner. Performed at [facility], [address], [place], [place] [address]      Time coordinating discharge: 35 minutes  SIGNED:   Glade Lloyd, MD  [facility] 08/25/2019, 8:58 AM

## 2019-08-25 NOTE — Consult Note (Signed)
Neurology Short Note  Carotid US complete  Stoke workup complete   Follow PT/OT/ST recommendations  Continue ASA/Statin  Will sign off, please call back with any questions

## 2019-08-25 NOTE — Progress Notes (Signed)
Nsg Discharge Note  Admit Date:  08/23/2019 Discharge date: 08/25/2019   Seward Speck to be D/C'd Home per MD order.  AVS completed.      Discharge Medication: Allergies as of 08/25/2019      Reactions   Lisinopril Rash      Medication List    STOP taking these medications   HYDROcodone-acetaminophen 10-325 MG tablet Commonly known as: NORCO     TAKE these medications   aspirin 325 MG tablet Take 1 tablet (325 mg total) by mouth daily. Start taking on: August 26, 2019   atorvastatin 40 MG tablet Commonly known as: LIPITOR Take 1 tablet (40 mg total) by mouth daily at 6 PM.   fluticasone 50 MCG/ACT nasal spray Commonly known as: FLONASE Place 2 sprays into both nostrils daily.   folic acid 1 MG tablet Commonly known as: FOLVITE Take 1 tablet (1 mg total) by mouth daily. Start taking on: August 26, 2019   losartan-hydrochlorothiazide 100-25 MG tablet Commonly known as: HYZAAR Take 1 tablet by mouth daily. NEEDS ANNUAL VISIT FOR FURTHER REFILLS   metoprolol tartrate 25 MG tablet Commonly known as: LOPRESSOR Take 1 tablet (25 mg total) by mouth 2 (two) times daily.   multivitamin with minerals Tabs tablet Take 1 tablet by mouth daily. Start taking on: August 26, 2019   senna-docusate 8.6-50 MG tablet Commonly known as: Senokot-S Take 1 tablet by mouth at bedtime as needed for mild constipation.   thiamine 100 MG tablet Take 1 tablet (100 mg total) by mouth daily. Start taking on: August 26, 2019       Discharge Assessment: Vitals:   08/25/19 0350 08/25/19 0809  BP: (!) 146/99 (!) 152/97  Pulse:  69  Resp: 14   Temp: 98.5 F (36.9 C) 98.3 F (36.8 C)  SpO2: 100% 98%   Skin clean, dry and intact without evidence of skin break down, no evidence of skin tears noted. IV catheter discontinued intact. Site without signs and symptoms of complications - no redness or edema noted at insertion site, patient denies c/o pain - only slight tenderness at  site.  Dressing with slight pressure applied.  D/c Instructions-Education: Discharge instructions given to patient/family with verbalized understanding. D/c education completed with patient/family including follow up instructions, medication list, d/c activities limitations if indicated, with other d/c instructions as indicated by MD - patient able to verbalize understanding, all questions fully answered. Patient instructed to return to ED, call 911, or call MD for any changes in condition.  Patient escorted via [place], and D/C home via private auto.  Hiram Comber, RN 08/25/2019 9:58 AM

## 2019-08-25 NOTE — Care Management (Signed)
Patient declined DME, referrals made for OP PT OT to [facility]. Patient is aware he will be receiving a call to schedule an appointment soon. Added to AVS

## 2019-08-26 ENCOUNTER — Telehealth: Payer: Self-pay | Admitting: *Deleted

## 2019-08-26 MED FILL — Perflutren Lipid Microsphere IV Susp 1.1 MG/ML: INTRAVENOUS | Qty: 10 | Status: AC

## 2019-08-26 NOTE — Telephone Encounter (Signed)
Pt was D/C from hosp 08/25/19 and was told to f/u w/PCP. Per chart appt has been made for 09/02/19. Tried calling pt to verify appt and completed TCM questionnaire but there was no answer LMOM RTC.Marland Kitchen/LMB

## 2019-08-28 NOTE — Telephone Encounter (Signed)
Tried reaching out to pt several times. No success.. no return call back. Not able to complete TCM questionnaire. Pt last saw Dr. Sharlet Salina back in 2017 will need to update demographics @ appt.Marland KitchenJohny Chess

## 2019-09-02 ENCOUNTER — Other Ambulatory Visit: Payer: Self-pay

## 2019-09-02 ENCOUNTER — Encounter: Payer: Self-pay | Admitting: Internal Medicine

## 2019-09-02 ENCOUNTER — Ambulatory Visit (INDEPENDENT_AMBULATORY_CARE_PROVIDER_SITE_OTHER): Payer: 59 | Admitting: Internal Medicine

## 2019-09-02 ENCOUNTER — Other Ambulatory Visit (INDEPENDENT_AMBULATORY_CARE_PROVIDER_SITE_OTHER): Payer: 59

## 2019-09-02 VITALS — BP 120/82 | HR 96 | Temp 99.4°F | Ht 72.0 in | Wt 272.0 lb

## 2019-09-02 DIAGNOSIS — F1020 Alcohol dependence, uncomplicated: Secondary | ICD-10-CM | POA: Diagnosis not present

## 2019-09-02 DIAGNOSIS — I10 Essential (primary) hypertension: Secondary | ICD-10-CM | POA: Diagnosis not present

## 2019-09-02 DIAGNOSIS — F17200 Nicotine dependence, unspecified, uncomplicated: Secondary | ICD-10-CM | POA: Diagnosis not present

## 2019-09-02 DIAGNOSIS — Z8673 Personal history of transient ischemic attack (TIA), and cerebral infarction without residual deficits: Secondary | ICD-10-CM | POA: Diagnosis not present

## 2019-09-02 LAB — COMPREHENSIVE METABOLIC PANEL
ALT: 32 U/L (ref 0–53)
AST: 15 U/L (ref 0–37)
Albumin: 4.4 g/dL (ref 3.5–5.2)
Alkaline Phosphatase: 84 U/L (ref 39–117)
BUN: 35 mg/dL — ABNORMAL HIGH (ref 6–23)
CO2: 26 mEq/L (ref 19–32)
Calcium: 11 mg/dL — ABNORMAL HIGH (ref 8.4–10.5)
Chloride: 97 mEq/L (ref 96–112)
Creatinine, Ser: 1.44 mg/dL (ref 0.40–1.50)
GFR: 51.47 mL/min — ABNORMAL LOW (ref >60.00)
Glucose, Bld: 131 mg/dL — ABNORMAL HIGH (ref 70–99)
Potassium: 3.3 mEq/L — ABNORMAL LOW (ref 3.5–5.1)
Sodium: 135 mEq/L (ref 135–145)
Total Bilirubin: 1 mg/dL (ref 0.2–1.2)
Total Protein: 8.1 g/dL (ref 6.0–8.3)

## 2019-09-02 LAB — CBC
HCT: 45.4 % (ref 39.0–52.0)
Hemoglobin: 15.8 g/dL (ref 13.0–17.0)
MCHC: 34.8 g/dL (ref 30.0–36.0)
MCV: 97.7 fl (ref 78.0–100.0)
Platelets: 220 10*3/uL (ref 150.0–400.0)
RBC: 4.65 Mil/uL (ref 4.22–5.81)
RDW: 12.6 % (ref 11.5–15.5)
WBC: 13.6 10*3/uL — ABNORMAL HIGH (ref 4.0–10.5)

## 2019-09-02 MED ORDER — LOSARTAN POTASSIUM-HCTZ 100-25 MG PO TABS: 1.0000 | ORAL_TABLET | Freq: Every day | ORAL | 3 refills | Status: DC

## 2019-09-02 MED ORDER — METOPROLOL TARTRATE 25 MG PO TABS: 25.0000 mg | ORAL_TABLET | Freq: Two times a day (BID) | ORAL | 3 refills | Status: DC

## 2019-09-02 MED ORDER — ATORVASTATIN CALCIUM 40 MG PO TABS: 40.0000 mg | ORAL_TABLET | Freq: Every day | ORAL | 3 refills | Status: DC

## 2019-09-02 NOTE — Assessment & Plan Note (Signed)
BP at goal on meds. Refilled metoprolol and losartan/hctz. Reminded about the need for regular follow up. Checking CMP.

## 2019-09-02 NOTE — Assessment & Plan Note (Signed)
Counseled to cut back or quit altogether to reduce risk of stroke.

## 2019-09-02 NOTE — Progress Notes (Signed)
   Subjective:   Patient ID: Anthony Harrington, male    DOB: Feb 22, 1966, 53 y.o.   MRN: [id number]  HPI The patient is a 53 YO man coming in for hospital follow up (in for stroke with right sided facial drooping, confirmed on CT and MRI, workup done without cause, had been out of all medications for some time due to lack of follow up, with alcohol overuse and smoking 2 PPD). He is still having right facial drooping. Denies speech changes. Some mental slowing he thinks. Some fatigue and per family he is not doing anything since being out of hospital. Denies abdominal pain, diarrhea, nausea, constipation. Denies arm or leg weakness. Denies arm or leg numbness. Is taking his medications and denies side effect. Still smoking and would like to quit. Has cut back alcohol significantly. Denies new problems. Needs FMLA.  PMH, [facility], social history reviewed and updated  08/13/19 last day worked  Review of Systems  Constitutional: Positive for activity change, appetite change and fatigue.  HENT: Negative.   Eyes: Negative.   Respiratory: Negative for cough, chest tightness and shortness of breath.   Cardiovascular: Negative for chest pain, palpitations and leg swelling.  Gastrointestinal: Negative for abdominal distention, abdominal pain, constipation, diarrhea, nausea and vomiting.  Musculoskeletal: Negative.   Skin: Negative.   Neurological: Positive for facial asymmetry.  Psychiatric/Behavioral: Negative.     Objective:  Physical Exam Constitutional:      Appearance: He is well-developed.  HENT:     Head: Normocephalic and atraumatic.     Comments: Right sided facial drooping, sensation intact Neck:     Musculoskeletal: Normal range of motion.  Cardiovascular:     Rate and Rhythm: Normal rate and regular rhythm.  Pulmonary:     Effort: Pulmonary effort is normal. No respiratory distress.     Breath sounds: Normal breath sounds. No wheezing or rales.  Abdominal:     General: Bowel sounds are  normal. There is no distension.     Palpations: Abdomen is soft.     Tenderness: There is no abdominal tenderness. There is no rebound.  Musculoskeletal:        General: No tenderness.  Skin:    General: Skin is warm and dry.  Neurological:     Mental Status: He is alert and oriented to person, place, and time.     Cranial Nerves: Cranial nerve deficit present.     Sensory: No sensory deficit.     Coordination: Coordination normal.     Vitals:   09/02/19 0928  BP: 120/82  Pulse: 96  Temp: 99.4 F (37.4 C)  TempSrc: Oral  SpO2: 97%  Weight: 272 lb (123.4 kg)  Height: 6' (1.829 m)    This visit occurred during the SARS-CoV-2 public health emergency.  Safety protocols were in place, including screening questions prior to the visit, additional usage of staff PPE, and extensive cleaning of exam room while observing appropriate contact time as indicated for disinfecting solutions.   Assessment & Plan:

## 2019-09-02 NOTE — Assessment & Plan Note (Signed)
With persistent right facial drooping. Advised to keep taking BP medication and statin. Advised to stop smoking and drinking alcohol.

## 2019-09-02 NOTE — Assessment & Plan Note (Signed)
Advised to quit and can use nicotine patches to help.

## 2019-09-02 NOTE — Patient Instructions (Signed)
We will check the labs today.   We will do the FMLA papers when they come.

## 2019-09-05 ENCOUNTER — Encounter: Payer: Self-pay | Admitting: Neurology

## 2019-09-05 ENCOUNTER — Other Ambulatory Visit: Payer: Self-pay

## 2019-09-05 ENCOUNTER — Ambulatory Visit (INDEPENDENT_AMBULATORY_CARE_PROVIDER_SITE_OTHER): Payer: 59 | Admitting: Neurology

## 2019-09-05 VITALS — BP 132/88 | HR 88 | Temp 97.2°F | Ht 72.0 in | Wt 272.0 lb

## 2019-09-05 DIAGNOSIS — I639 Cerebral infarction, unspecified: Secondary | ICD-10-CM | POA: Insufficient documentation

## 2019-09-05 DIAGNOSIS — R0683 Snoring: Secondary | ICD-10-CM

## 2019-09-05 MED ORDER — ATORVASTATIN CALCIUM 40 MG PO TABS: 40.0000 mg | ORAL_TABLET | Freq: Every day | ORAL | 4 refills | Status: DC

## 2019-09-05 MED ORDER — ASPIRIN 325 MG PO TABS: 325.0000 mg | ORAL_TABLET | Freq: Every day | ORAL | 4 refills | Status: AC

## 2019-09-05 MED ORDER — METOPROLOL TARTRATE 25 MG PO TABS: 25.0000 mg | ORAL_TABLET | Freq: Two times a day (BID) | ORAL | 4 refills | Status: DC

## 2019-09-05 MED ORDER — LOSARTAN POTASSIUM-HCTZ 100-25 MG PO TABS: 1.0000 | ORAL_TABLET | Freq: Every day | ORAL | 4 refills | Status: DC

## 2019-09-05 NOTE — Progress Notes (Signed)
PATIENT: Anthony Harrington DOB: 1965-10-20  Chief Complaint  Patient presents with  . Cerebrovascular Accident    Hospital follow up from CVA on 08/23/2019.  He is here with his partner, Alinda Money.  He is still having right-sided weakness and speech difficulty.  He has not started any type of therapy yet.  He is now taking aspirin 325mg  QD, Lipitor 40mg  QD and Lopressor 25mg  BID.  PCP    , MD - referred from hospital     HISTORICAL  DAKHARI ZUVER is a 53 year old male, seen in request by his primary care physician Dr. Myrlene Broker for evaluation of stroke, he is accompanied by his domestic partner Jannifer Rodney at today's visit on September 05, 2019.  I have reviewed and summarized the referring note from the referring physician.  Prior to hospital admission on August 23, 2019, he was not taking any medications, per emergency room record, he does have a history of hypertension, hyperlipidemia, smoked two pack of cigarette daily, drink 2 of 24 ounce of beers daily, sedentary, works as a Alinda Money, does require lifting sometimes.  He went to bed fine at his baseline on August 19, 2019, woke up next morning, was noted to have slurred speech, right facial droop, he denies significant right-sided weakness, no sensory changes  I personally reviewed MRI of the brain, acute stroke at left thalamocapsular junction,  MRA of the brain showed no large vessel disease, mild intracranial atherosclerosis  LDL 175, A1c 5.8, triglyceride 225  Echocardiogram showed ejection fraction 50 to 55%  He has quit drinking since the incident, only smokes 3 to 4 cigarettes daily, continue have significant slurred speech, no dysphagia, was noted to have mild right arm and leg weakness, but denies significant gait abnormality.  He is now on Lipitor 40 mg daily, Lopressor and Hyzaar for blood pressure control, aspirin 325 mg daily  He also complains of excessive day time fatigue,  sleepiness, loud snoring, frequent waking at night  REVIEW OF SYSTEMS: Full 14 system review of systems performed and notable only for as above All other review of systems were negative.  ALLERGIES: Allergies  Allergen Reactions  . Lisinopril Rash    HOME MEDICATIONS: Current Outpatient Medications  Medication Sig Dispense Refill  . aspirin 325 MG tablet Take 1 tablet (325 mg total) by mouth daily. 30 tablet 0  . atorvastatin (LIPITOR) 40 MG tablet Take 1 tablet (40 mg total) by mouth daily at 6 PM. 90 tablet 3  . folic acid (FOLVITE) 1 MG tablet Take 1 tablet (1 mg total) by mouth daily. 30 tablet 0  . losartan-hydrochlorothiazide (HYZAAR) 100-25 MG tablet Take 1 tablet by mouth daily. 90 tablet 3  . metoprolol tartrate (LOPRESSOR) 25 MG tablet Take 1 tablet (25 mg total) by mouth 2 (two) times daily. 180 tablet 3   No current facility-administered medications for this visit.     PAST MEDICAL HISTORY: Past Medical History:  Diagnosis Date  . CVA (cerebral vascular accident) (HCC) 08/2019  . DDD (degenerative disc disease)    lumbar and cervical  . Hyperlipidemia   . Hypertension   . Seizures (HCC)    x 1 in his teen years    PAST SURGICAL HISTORY: Past Surgical History:  Procedure Laterality Date  . FRACTURE SURGERY     left hip  . lumbar spine surgery  2003   microdiskectomy L5-S1 August 21, 2019)    FAMILY HISTORY: Family History  Problem Relation Age of  Onset  . Hypertension Mother   . Diabetes Father   . Heart disease Father   . Hyperlipidemia Father   . Stroke Father   . Diabetes Brother   . Heart disease Brother [id number]  . COPD Neg Hx   . Cancer Neg Hx     SOCIAL HISTORY: Social History   Socioeconomic History  . Marital status: Media plannerDomestic Partner    Spouse name: Not on file  . Number of children: 0  . Years of education: 1312  . Highest education level: Not on file  Occupational History  . Occupation: Production designer, theatre/television/filmmanager  Social Needs  . Financial resource strain: Not  on file  . Food insecurity    Worry: Not on file    Inability: Not on file  . Transportation needs    Medical: Not on file    Non-medical: Not on file  Tobacco Use  . Smoking status: Current Every Day Smoker    Packs/day: 2.00    Years: 36.00    Pack years: 72.00    Types: Cigarettes  . Smokeless tobacco: Never Used  Substance and Sexual Activity  . Alcohol use: Yes    Alcohol/week: 14.0 standard drinks    Types: 14 Standard drinks or equivalent per week    Comment: Stopped drinking after stroke - prior use 48 oz daily; does have a problem with ETOH,.  . Drug use: No  . Sexual activity: Yes    Partners: Female  Lifestyle  . Physical activity    Days per week: Not on file    Minutes per session: Not on file  . Stress: Not on file  Relationships  . Social Musicianconnections    Talks on phone: Not on file    Gets together: Not on file    Attends religious service: Not on file    Active member of club or organization: Not on file    Attends meetings of clubs or organizations: Not on file    Relationship status: Not on file  . Intimate partner violence    Fear of current or ex partner: Not on file    Emotionally abused: Not on file    Physically abused: Not on file    Forced sexual activity: Not on file  Other Topics Concern  . Not on file  Social History Narrative   Work - [employer] - does a little of everything. Lives with his partner, Alinda Moneyony.  No daily caffeine use.  Right-handed.     PHYSICAL EXAM   Vitals:   09/05/19 1455  BP: 132/88  Pulse: 88  Temp: (!) 97.2 F (36.2 C)  Weight: 272 lb (123.4 kg)  Height: 6' (1.829 m)    Not recorded      Body mass index is 36.89 kg/m.  PHYSICAL EXAMNIATION:  Gen: NAD, conversant, well nourised, well groomed                     Cardiovascular: Regular rate rhythm, no peripheral edema, warm, nontender. Eyes: Conjunctivae clear without exudates or hemorrhage Neck: Supple, no carotid bruits. Pulmonary: Clear to  auscultation bilaterally   NEUROLOGICAL EXAM:  MENTAL STATUS: Speech:    Slurred speech normal comprehension.  Cognition:     Orientation to time, place and person     Normal recent and remote memory     Normal Attention span and concentration     Normal Language, naming, repeating,spontaneous speech     Fund of knowledge   CRANIAL NERVES:  CN II: Visual fields are full to confrontation.  Pupils are round equal and briskly reactive to light. CN III, IV, VI: extraocular movement are normal. No ptosis. CN V: Facial sensation is intact to pinprick in all 3 divisions bilaterally. Corneal responses are intact.  CN VII: Right lower face weakness CN VIII: Hearing is normal to causal conversation. CN IX, X: Palate elevates symmetrically. Phonation is normal. CN XI: Head turning and shoulder shrug are intact CN XII: Tongue is midline with normal movements and no atrophy.  MOTOR: Fixation of right arm on rapid rotating movement, mild right arm pronation drift.  Mild right leg hip flexion weakness.  REFLEXES: Reflexes are 2+ and symmetric at the biceps, triceps, knees, and ankles. Plantar responses are flexor.  SENSORY: Intact to light touch, pinprick and vibratory sensation are intact in fingers and toes.  COORDINATION: There is no trunk or limb dysmetria noted.  GAIT/STANCE: He needs push-up to get up from seated position, steady, difficulty perform tandem walking   DIAGNOSTIC DATA (LABS, IMAGING, TESTING) - I reviewed patient records, labs, notes, testing and imaging myself where available.   ASSESSMENT AND PLAN  BARNARD SHARPS is a 53 y.o. 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 Ph.D.  [facility] [address], [place],  [address] Ph: [phone] Fax: [phone]  CC: Referring Provider

## 2019-09-09 ENCOUNTER — Encounter: Payer: Self-pay | Admitting: Internal Medicine

## 2019-10-15 ENCOUNTER — Emergency Department (HOSPITAL_COMMUNITY)
Admission: EM | Admit: 2019-10-15 | Discharge: 2019-10-15 | Disposition: A | Discharge status: Home / Self Care | Payer: 59 | Attending: Emergency Medicine | Admitting: Emergency Medicine

## 2019-10-15 ENCOUNTER — Ambulatory Visit: Payer: 59 | Admitting: Physical Therapy

## 2019-10-15 ENCOUNTER — Ambulatory Visit: Payer: Self-pay | Admitting: Internal Medicine

## 2019-10-15 ENCOUNTER — Encounter: Payer: Self-pay | Admitting: Physical Therapy

## 2019-10-15 ENCOUNTER — Ambulatory Visit: Payer: 59 | Admitting: Occupational Therapy

## 2019-10-15 ENCOUNTER — Other Ambulatory Visit: Payer: Self-pay

## 2019-10-15 ENCOUNTER — Emergency Department (HOSPITAL_COMMUNITY): Payer: 59

## 2019-10-15 VITALS — BP 202/150 | HR 140

## 2019-10-15 DIAGNOSIS — R000 Tachycardia, unspecified: Secondary | ICD-10-CM | POA: Insufficient documentation

## 2019-10-15 DIAGNOSIS — R030 Elevated blood-pressure reading, without diagnosis of hypertension: Secondary | ICD-10-CM | POA: Insufficient documentation

## 2019-10-15 DIAGNOSIS — I10 Essential (primary) hypertension: Secondary | ICD-10-CM | POA: Diagnosis not present

## 2019-10-15 DIAGNOSIS — Z8673 Personal history of transient ischemic attack (TIA), and cerebral infarction without residual deficits: Secondary | ICD-10-CM | POA: Diagnosis not present

## 2019-10-15 DIAGNOSIS — F17210 Nicotine dependence, cigarettes, uncomplicated: Secondary | ICD-10-CM | POA: Insufficient documentation

## 2019-10-15 DIAGNOSIS — Z79899 Other long term (current) drug therapy: Secondary | ICD-10-CM | POA: Diagnosis not present

## 2019-10-15 DIAGNOSIS — Z7982 Long term (current) use of aspirin: Secondary | ICD-10-CM | POA: Insufficient documentation

## 2019-10-15 LAB — URINALYSIS, ROUTINE W REFLEX MICROSCOPIC
Bilirubin Urine: NEGATIVE
Glucose, UA: NEGATIVE mg/dL
Hgb urine dipstick: NEGATIVE
Ketones, ur: NEGATIVE mg/dL
Leukocytes,Ua: NEGATIVE
Nitrite: NEGATIVE
Protein, ur: NEGATIVE mg/dL
Specific Gravity, Urine: 1.006 (ref 1.005–1.030)
pH: 7 (ref 5.0–8.0)

## 2019-10-15 LAB — COMPREHENSIVE METABOLIC PANEL
ALT: 29 U/L (ref 0–44)
AST: 22 U/L (ref 15–41)
Albumin: 4.5 g/dL (ref 3.5–5.0)
Alkaline Phosphatase: 92 U/L (ref 38–126)
Anion gap: 12 (ref 5–15)
BUN: 14 mg/dL (ref 6–20)
CO2: 23 mmol/L (ref 22–32)
Calcium: 10.1 mg/dL (ref 8.9–10.3)
Chloride: 102 mmol/L (ref 98–111)
Creatinine, Ser: 0.95 mg/dL (ref 0.61–1.24)
GFR calc Af Amer: >60 mL/min (ref >60)
GFR calc non Af Amer: >60 mL/min (ref >60)
Glucose, Bld: 114 mg/dL — ABNORMAL HIGH (ref 70–99)
Potassium: 3.6 mmol/L (ref 3.5–5.1)
Sodium: 137 mmol/L (ref 135–145)
Total Bilirubin: 1 mg/dL (ref 0.3–1.2)
Total Protein: 7.4 g/dL (ref 6.5–8.1)

## 2019-10-15 LAB — CBC WITH DIFFERENTIAL/PLATELET
Abs Immature Granulocytes: 0.02 10*3/uL (ref 0.00–0.07)
Basophils Absolute: 0.1 10*3/uL (ref 0.0–0.1)
Basophils Relative: 1 %
Eosinophils Absolute: 0.2 10*3/uL (ref 0.0–0.5)
Eosinophils Relative: 3 %
HCT: 47.3 % (ref 39.0–52.0)
Hemoglobin: 15.5 g/dL (ref 13.0–17.0)
Immature Granulocytes: 0 %
Lymphocytes Relative: 25 %
Lymphs Abs: 2.2 10*3/uL (ref 0.7–4.0)
MCH: 32.3 pg (ref 26.0–34.0)
MCHC: 32.8 g/dL (ref 30.0–36.0)
MCV: 98.5 fL (ref 80.0–100.0)
Monocytes Absolute: 0.7 10*3/uL (ref 0.1–1.0)
Monocytes Relative: 8 %
Neutro Abs: 5.6 10*3/uL (ref 1.7–7.7)
Neutrophils Relative %: 63 %
Platelets: 252 10*3/uL (ref 150–400)
RBC: 4.8 MIL/uL (ref 4.22–5.81)
RDW: 12.1 % (ref 11.5–15.5)
WBC: 8.8 10*3/uL (ref 4.0–10.5)
nRBC: 0 % (ref 0.0–0.2)

## 2019-10-15 LAB — ETHANOL: Alcohol, Ethyl (B): <10 mg/dL (ref <10)

## 2019-10-15 LAB — D-DIMER, QUANTITATIVE: D-Dimer, Quant: 0.39 ug/mL-FEU (ref 0.00–0.50)

## 2019-10-15 LAB — RAPID URINE DRUG SCREEN, HOSP PERFORMED
Amphetamines: NOT DETECTED
Barbiturates: NOT DETECTED
Benzodiazepines: NOT DETECTED
Cocaine: NOT DETECTED
Opiates: NOT DETECTED
Tetrahydrocannabinol: NOT DETECTED

## 2019-10-15 MED ORDER — AMLODIPINE BESYLATE 5 MG PO TABS
5.0000 mg | ORAL_TABLET | Freq: Once | ORAL | Status: AC
  Administered 2019-10-15: 5 mg via ORAL
  Filled 2019-10-15: qty 1

## 2019-10-15 MED ORDER — AMLODIPINE BESYLATE 5 MG PO TABS: 5.0000 mg | ORAL_TABLET | Freq: Every day | ORAL | 0 refills | Status: DC

## 2019-10-15 NOTE — ED Notes (Signed)
Pt ambulated with steady gait. Denies SOB, dizziness or chest pain. VS  162/123 HR 116 96% RA RR 18

## 2019-10-15 NOTE — ED Triage Notes (Signed)
Pt states he went to physical therapist today who told him his bp was high, has not taken his medication for it today. Has facial droop from recent stroke. Denies any other complaints.

## 2019-10-15 NOTE — Telephone Encounter (Signed)
Cloe, P.T. with [facility] calling, pt present. Prior to starting PT, BP checked, automatic cuff; 178/120, few minutes later 216/159.  Checked manually, 202/159.  Pt denies headache, dizziness, asymptomatic.  H/O CVA 08/23/2019.  Pt states he has been taking BP meds. However states he takes lopressor once daily, ordered twice daily. Has taken meds this AM.  Pt directed to ED. States will follow disposition, has driver. Reason for Disposition . [1] Systolic BP  >= 200 OR Diastolic >= 120  AND [2] having NO cardiac or neurologic symptoms    H/O CVA  Answer Assessment - Initial Assessment Questions 1. BLOOD PRESSURE: "What is the blood pressure?" "Did you take at least two measurements 5 minutes apart?"    178/120    216/159 2. ONSET: "When did you take your blood pressure?"     Now with PT 3. HOW: "How did you obtain the blood pressure?" (e.g., visiting nurse, automatic home BP monitor)     Automatic cuff,  Manually  202/159 4. HISTORY: "Do you have a history of high blood pressure?"     Yes and CVA 5. MEDICATIONS: "Are you taking any medications for blood pressure?" "Have you missed any doses recently?"    NO 6. OTHER SYMPTOMS: "Do you have any symptoms?" (e.g., headache, chest pain, blurred vision, difficulty breathing, weakness)     no  Protocols used: HIGH BLOOD PRESSURE-A-AH

## 2019-10-15 NOTE — ED Provider Notes (Signed)
[facility] EMERGENCY DEPARTMENT Provider Note   CSN: [id number] Arrival date & time: 10/15/19  1227     History No chief complaint on file.   Anthony Harrington is a 54 y.o. male with a past medical history significant for CVA on 08/23/2019, hyperlipidemia, hypertension, and seizures who presents to the ED from his physical therapist office due to elevated blood pressure readings. Chart reviewed. Patient's BP was taken at his physical therapist office upon arrival and was 178/125 and was rechecked and increased to 216/159 which prompted them to send him straight to the ED for evaluation. Upon arrival to the ED, patient's blood pressure was 178/125 with heart rate of 101 which has gradually increased. Patient denies associative symptoms of chest pain, shortness of breath, weakness, or numbness/tingling. Patient just recently had a CVA on 08/23/2019 and has residual right facial droop and slurred speech. Patient is currently on HYZAAR and Lopressor which he states he took last night. Patient denies headache, changes to his urination, and visual changes. Patient denies drug and alcohol use.   Spoke to Carnuel, patient's partner, who notes that there have been no changes to his speech or facial droop since his stroke. He notes that patient had no complaints prior to arriving to the hospital except that he was hungry.   Past Medical History:  Diagnosis Date  . CVA (cerebral vascular accident) (HCC) 08/2019  . DDD (degenerative disc disease)    lumbar and cervical  . Hyperlipidemia   . Hypertension   . Seizures (HCC)    x 1 in his teen years    Patient Active Problem List   Diagnosis Date Noted  . Cerebrovascular accident (CVA) (HCC) 09/05/2019  . Snoring 09/05/2019  . History of completed stroke 08/23/2019  . Class 2 obesity with body mass index (BMI) of 36.0 to 36.9 in adult 08/23/2019  . EtOH dependence (HCC) 08/23/2019  . Hypercalcemia 03/15/2014  . DDD (degenerative disc  disease), lumbar 12/27/2012  . Routine health maintenance 02/12/2012  . CIGARETTE SMOKER 05/18/2010  . Dyslipidemia 10/12/2007  . Essential hypertension 10/12/2007  . HERNIATED CERVICAL DISC 10/12/2007  . Seizure disorder (HCC) 10/12/2007    Past Surgical History:  Procedure Laterality Date  . FRACTURE SURGERY     left hip  . lumbar spine surgery  2003   microdiskectomy L5-S1 Noel Gerold)       Family History  Problem Relation Age of Onset  . Hypertension Mother   . Diabetes Father   . Heart disease Father   . Hyperlipidemia Father   . Stroke Father   . Diabetes Brother   . Heart disease Brother 33  . COPD Neg Hx   . Cancer Neg Hx     Social History   Tobacco Use  . Smoking status: Current Every Day Smoker    Packs/day: 2.00    Years: 36.00    Pack years: 72.00    Types: Cigarettes  . Smokeless tobacco: Never Used  Substance Use Topics  . Alcohol use: Yes    Alcohol/week: 14.0 standard drinks    Types: 14 Standard drinks or equivalent per week    Comment: Stopped drinking after stroke - prior use 48 oz daily; does have a problem with ETOH,.  . Drug use: No    Home Medications Prior to Admission medications   Medication Sig Start Date End Date Taking? Authorizing Provider  aspirin 325 MG tablet Take 1 tablet (325 mg total) by mouth daily. 09/05/19  Yes  Marcial Pacas, MD  atorvastatin (LIPITOR) 40 MG tablet Take 1 tablet (40 mg total) by mouth daily at 6 PM. 09/05/19  Yes Marcial Pacas, MD  folic acid (FOLVITE) 1 MG tablet Take 1 tablet (1 mg total) by mouth daily. 08/26/19  Yes Aline August, MD  losartan-hydrochlorothiazide (HYZAAR) 100-25 MG tablet Take 1 tablet by mouth daily. 09/05/19  Yes Marcial Pacas, MD  metoprolol tartrate (LOPRESSOR) 25 MG tablet Take 1 tablet (25 mg total) by mouth 2 (two) times daily. 09/05/19  Yes Marcial Pacas, MD  amLODipine (NORVASC) 5 MG tablet Take 1 tablet (5 mg total) by mouth daily. 10/15/19   Suzy Bouchard, PA-C    Allergies      Lisinopril  Review of Systems   Review of Systems  Constitutional: Negative for chills and fever.  Eyes: Negative for visual disturbance.  Respiratory: Negative for shortness of breath.   Cardiovascular: Negative for chest pain, palpitations and leg swelling.  Gastrointestinal: Negative for abdominal pain, diarrhea, nausea and vomiting.  Genitourinary: Negative for difficulty urinating and dysuria.  Neurological: Positive for facial asymmetry (right facial droop from previous CVA). Negative for weakness and numbness.  All other systems reviewed and are negative.   Physical Exam Updated Vital Signs BP (!) 192/124   Pulse (!) 132   Temp 98.3 F (36.8 C) (Oral)   Resp 16   SpO2 100%   Physical Exam Vitals and nursing note reviewed.  Constitutional:      General: He is not in acute distress.    Appearance: He is not toxic-appearing.  HENT:     Head: Normocephalic.  Eyes:     Extraocular Movements: Extraocular movements intact.     Pupils: Pupils are equal, round, and reactive to light.  Neck:     Comments: No JVD Cardiovascular:     Rate and Rhythm: Regular rhythm. Tachycardia present.     Pulses: Normal pulses.     Heart sounds: Normal heart sounds. No murmur. No friction rub. No gallop.   Pulmonary:     Effort: Pulmonary effort is normal.     Breath sounds: Normal breath sounds.  Abdominal:     General: Abdomen is flat. Bowel sounds are normal. There is no distension.     Palpations: Abdomen is soft.     Tenderness: There is no abdominal tenderness. There is no right CVA tenderness, left CVA tenderness, guarding or rebound.  Musculoskeletal:     Cervical back: Neck supple.     Comments: Able to move all 4 extremities without difficulty. No lower extremity edema. Negative homans sign bilaterally  Skin:    General: Skin is warm.     Capillary Refill: Capillary refill takes less than 2 seconds.  Neurological:     General: No focal deficit present.     Mental Status:  He is alert.     Comments: Speech is occasionally slurred, able to follow commands, right sided facial droop which patient notes is from previous CVA CN III-XII intact except facial nerve due to facial droop Normal strength in upper and lower extremities bilaterally including dorsiflexion and plantar flexion, strong and equal grip strength Sensation grossly intact throughout Moves extremities without ataxia, coordination intact   Psychiatric:        Mood and Affect: Mood normal.     ED Results / Procedures / Treatments   Labs (all labs ordered are listed, but only abnormal results are displayed) Labs Reviewed  COMPREHENSIVE METABOLIC PANEL - Abnormal; Notable for the  following components:      Result Value   Glucose, Bld 114 (*)    All other components within normal limits  URINALYSIS, ROUTINE W REFLEX MICROSCOPIC  CBC WITH DIFFERENTIAL/PLATELET  RAPID URINE DRUG SCREEN, HOSP PERFORMED  ETHANOL  D-DIMER, QUANTITATIVE (NOT AT [facility])    EKG EKG Interpretation  Date/Time:  Tuesday October 15 2019 14:00:33 EST Ventricular Rate:  106 PR Interval:    QRS Duration: 93 QT Interval:  349 QTC Calculation: 464 R Axis:   -28 Text Interpretation: Sinus tachycardia Borderline left axis deviation Anterior infarct, old Confirmed by Virgina Norfolk [phone]) on 10/15/2019 2:29:04 PM   Radiology DG Chest Portable 1 View  Result Date: 10/15/2019 CLINICAL DATA:  Hypertension and tachycardia EXAM: PORTABLE CHEST 1 VIEW COMPARISON:  Feb 09, 2012 FINDINGS: Lungs are clear. Heart is mildly enlarged with pulmonary vascularity normal. No adenopathy. No bone lesions. No pneumothorax. IMPRESSION: Cardiomegaly.  Lungs clear.  No adenopathy. Electronically Signed   By: Bretta Bang III [name]   On: 10/15/2019 13:49    Procedures Procedures (including critical care time)  Medications Ordered in ED Medications  amLODipine (NORVASC) tablet 5 mg (5 mg Oral Given 10/15/19 1527)    ED Course  I have  reviewed the triage vital signs and the nursing notes.  Pertinent labs & imaging results that were available during my care of the patient were reviewed by me and considered in my medical decision making (see chart for details).    MDM Rules/Calculators/A&P                     54 year old male presents to the ED from physical therapist office due to elevated blood pressure readings.  Patient recently had a CVA in November 2020.  Patient has been compliant with his blood pressure medication and notes he last took his medication last night.  Patient denies all complaints of numbness and tingling, chest pain, shortness of breath, and unilateral weakness. Patient has a right-sided facial droop and slurred speech from previous CVA. Upon arrival, patient tachycardic at 132 with elevated blood pressure at 181/133. Physical exam reassuring. Normal neurological exam except for right facial droop and slurred speech from previous CVA. Reviewed chart from patient's most recent neurology appointment on 09/05/2019 who noted right facial droop and slurred speech. Chart also noted that patient is suppose to be taking Lopressor BID and patient is only taking it once a day. Will obtain routine labs to rule out end stage organ damage. Discussed case with Dr. Lockie Mola who agrees with assessment and plan.   CBC unremarkable with no leukocytosis.  Normal ethanol level.  CMP reassuring with mild hyperglycemia at 114, but otherwise unremarkable.  Chest x-ray personally reviewed which is significant for cardiomegaly likely due to uncontrolled hypertension, but no signs of pneumonia or pneumothorax.  EKG reviewed which demonstrates sinus tachycardia with borderline left axis deviation likely due to longstanding uncontrolled hypertension.  No signs of acute ischemia.  UA completely unremarkable with no proteinuria, hematuria, or signs of infection. UDS unremarkable. No signs of end organ damage, doubt hypertensive emergency.   Blood  pressure still remains elevated. Upon reassessment of patient, patient notes he feels very anxious being here which could attribute some to his elevated blood pressure readings and tachycardia. Will give 5mg  of amlodipine and recheck vitals. D-dimer normal, doubt DVT/PE. Suspect elevated HR most likely due to anxiety with normal EKG and negative d-dimer. Will discharge patient home with 12 tablets of  amlodipine 5mg  to add on to his current blood pressure regimen. Patient advised to call his PCP tomorrow to schedule an appointment for further evaluation of hypertension and medication management. Strict ED precautions discussed with patient. Patient states understanding and agrees to plan. Patient discharged home in no acute distress and stable vitals  Final Clinical Impression(s) / ED Diagnoses Final diagnoses:  Elevated blood pressure reading  Tachycardia    Rx / DC Orders ED Discharge Orders         Ordered    amLODipine (NORVASC) 5 MG tablet  Daily     10/15/19 1518           12/13/19, PA-C 10/15/19 1645    [place], 12/13/19, DO 10/16/19 [phone]

## 2019-10-15 NOTE — Discharge Instructions (Addendum)
As discussed, all of your labs looked good today. Your EKG and chest x-ray showed some signs of chronic uncontrolled hypertension. You were given 1 blood pressure medication here in the ED. I am sending you home with 12 tablets of the blood pressure medication to add onto your current blood pressure medication. DO NOT TAKE THE NEW TABLET TONIGHT SINCE YOU WERE GIVEN IT IN THE ER. Call you doctor to schedule an appointment this week for further blood pressure management. Return to the ER if you develop chest pain or worsening symptoms.

## 2019-10-15 NOTE — Therapy (Signed)
[facility] [address] [place], [place], [address] Phone: [phone]   Fax:  [phone]  Patient Details  Name: Anthony Harrington MRN: [id number] Date of Birth: [birth year] Referring Provider:  Myrlene Broker, *  Encounter Date: 10/15/2019   Patient arrived with partner to PT session for evaluation. Took BP at rest: 178/125 with pulse of 101 bpm. Pt reporting he is feeling asymptomatic with no signs/symptoms of a CVA. Pt stating he is taking his BP medication 1x per day at 6PM (however in chart it is listed for patient to take Lopressor 2x daily). Called pt's PCP office, while on the phone, re-checked via automatic cuff with BP at 216/159 with HR at 140 bpm. Had another PT check it manually with reading of 202/150. Pt still reporting he is feeling asymptomatic. Instructed pt and pt's partner (while on the phone with PCP's office) to go to the ED IMMEDIATELY, with both verbalizing understanding and heading to the ED right now. Pt's partner able to drive pt. PCP office aware of recent BP readings.  Drake Leach , PT, DPT 10/15/2019, 12:24 PM  [facility] [address] [place], [place], [address] Phone: [phone]   Fax:  [phone]

## 2019-10-16 ENCOUNTER — Encounter: Payer: Self-pay | Admitting: Family

## 2019-10-16 ENCOUNTER — Other Ambulatory Visit: Payer: Self-pay

## 2019-10-16 ENCOUNTER — Ambulatory Visit (INDEPENDENT_AMBULATORY_CARE_PROVIDER_SITE_OTHER): Payer: 59 | Admitting: Family

## 2019-10-16 VITALS — BP 180/110 | HR 98 | Temp 98.0°F | Ht 72.0 in | Wt 264.8 lb

## 2019-10-16 DIAGNOSIS — I10 Essential (primary) hypertension: Secondary | ICD-10-CM | POA: Diagnosis not present

## 2019-10-16 DIAGNOSIS — F1020 Alcohol dependence, uncomplicated: Secondary | ICD-10-CM

## 2019-10-16 MED ORDER — FOLIC ACID 1 MG PO TABS: 1.0000 mg | ORAL_TABLET | Freq: Every day | ORAL | 1 refills | Status: DC

## 2019-10-16 NOTE — Progress Notes (Signed)
Anthony Harrington is a 54 y.o. male with the following history as recorded in EpicCare:  Patient Active Problem List   Diagnosis Date Noted  . Cerebrovascular accident (CVA) (HCC) 09/05/2019  . Snoring 09/05/2019  . History of completed stroke 08/23/2019  . Class 2 obesity with body mass index (BMI) of 36.0 to 36.9 in adult 08/23/2019  . EtOH dependence (HCC) 08/23/2019  . Hypercalcemia 03/15/2014  . DDD (degenerative disc disease), lumbar 12/27/2012  . Routine health maintenance 02/12/2012  . CIGARETTE SMOKER 05/18/2010  . Dyslipidemia 10/12/2007  . Essential hypertension 10/12/2007  . HERNIATED CERVICAL DISC 10/12/2007  . Seizure disorder (HCC) 10/12/2007    Current Outpatient Medications  Medication Sig Dispense Refill  . amLODipine (NORVASC) 5 MG tablet Take 1 tablet (5 mg total) by mouth daily. 12 tablet 0  . aspirin 325 MG tablet Take 1 tablet (325 mg total) by mouth daily. 90 tablet 4  . atorvastatin (LIPITOR) 40 MG tablet Take 1 tablet (40 mg total) by mouth daily at 6 PM. 90 tablet 4  . folic acid (FOLVITE) 1 MG tablet Take 1 tablet (1 mg total) by mouth daily. 30 tablet 1  . losartan-hydrochlorothiazide (HYZAAR) 100-25 MG tablet Take 1 tablet by mouth daily. 90 tablet 4  . metoprolol tartrate (LOPRESSOR) 25 MG tablet Take 1 tablet (25 mg total) by mouth 2 (two) times daily. 180 tablet 4   No current facility-administered medications for this visit.    Allergies: Lisinopril  Past Medical History:  Diagnosis Date  . CVA (cerebral vascular accident) (HCC) 08/2019  . DDD (degenerative disc disease)    lumbar and cervical  . Hyperlipidemia   . Hypertension   . Seizures (HCC)    x 1 in his teen years    Past Surgical History:  Procedure Laterality Date  . FRACTURE SURGERY     left hip  . lumbar spine surgery  2003   microdiskectomy L5-S1 Noel Gerold)    Family History  Problem Relation Age of Onset  . Hypertension Mother   . Diabetes Father   . Heart disease Father   .  Hyperlipidemia Father   . Stroke Father   . Diabetes Brother   . Heart disease Brother 48  . COPD Neg Hx   . Cancer Neg Hx     Social History   Tobacco Use  . Smoking status: Current Every Day Smoker    Packs/day: 2.00    Years: 36.00    Pack years: 72.00    Types: Cigarettes  . Smokeless tobacco: Never Used  Substance Use Topics  . Alcohol use: Yes    Alcohol/week: 14.0 standard drinks    Types: 14 Standard drinks or equivalent per week    Comment: Stopped drinking after stroke - prior use 48 oz daily; does have a problem with ETOH,.    Subjective:  Patient is accompanied by his partner today; patient was seen at the ER yesterday with very high blood pressure; was at PT appointment yesterday and pressure was noted to be extremely elevated at 216/159; sent to ER for immediate evaluation; per patient, he had no symptoms yesterday- denies chest pain, shortness of breath, dizziness or blurred vision; EKG and labs done at ER yesterday were unremarkable;  It was noted that patient has not been taking his Metoprolol twice a day - only daily; he was discharged on Amlodipine 5 mg and asked to see his PCP in close follow-up; per patient, he has not taken his blood pressure  medications today; plans to pick up the Amlodipine upon leaving here.    Objective:  Vitals:   10/16/19 1221  BP: (!) 180/110  Pulse: 98  Temp: 98 F (36.7 C)  TempSrc: Oral  SpO2: 98%  Weight: 264 lb 12.8 oz (120.1 kg)  Height: 6' (1.829 m)    General: Well developed, well nourished, in no acute distress  Skin : Warm and dry.  Head: Normocephalic and atraumatic  Lungs: Respirations unlabored; clear to auscultation bilaterally without wheeze, rales, rhonchi  CVS exam: normal rate and regular rhythm.  Neurologic: Alert and oriented; speech intact; face symmetrical; moves all extremities well; CNII-XII intact without focal deficit   Assessment:  1. Essential hypertension   2. Uncomplicated alcohol dependence  (Alpha)     Plan:  1. Stressed need to take medications as prescribed; needs to take Metoprolol bid, Losartan HCT daily and start the Amlodipine prescribed yesterday; follow-up with his PCP in 1 week for re-check; may need to increase to 10 mg daily; 2. Refill updated on Folic Acid;    This visit occurred during the SARS-CoV-2 public health emergency.  Safety protocols were in place, including screening questions prior to the visit, additional usage of staff PPE, and extensive cleaning of exam room while observing appropriate contact time as indicated for disinfecting solutions.     Return in about 1 week (around 10/23/2019).  No orders of the defined types were placed in this encounter.   Requested Prescriptions   Signed Prescriptions Disp Refills  . folic acid (FOLVITE) 1 MG tablet 30 tablet 1    Sig: Take 1 tablet (1 mg total) by mouth daily.

## 2019-10-22 ENCOUNTER — Other Ambulatory Visit: Payer: Self-pay

## 2019-10-22 ENCOUNTER — Encounter: Payer: Self-pay | Admitting: Internal Medicine

## 2019-10-22 ENCOUNTER — Ambulatory Visit: Payer: 59 | Admitting: Occupational Therapy

## 2019-10-22 NOTE — Therapy (Signed)
[facility] [address] [place], [place], [address] Phone: [phone]   Fax:  [phone]  Patient Details  Name: Anthony Harrington MRN: [id number] Date of Birth: [birth year] Referring Provider:  Myrlene Broker, *  Encounter Date: 10/22/2019 Anthony Harrington arrived for his OT appointment today. His BP was 177/130.  He reports he took his morning medications today. He was not seen for OT eval as  a result. He is asymptomatic. Pt was placed on new BP meds by MD and he only started them on Friday.  Therapist attempted to call PCP office however  was on hold for a long time and was unable to reach assistance. Pt/ partner agreed to go home and call PCP office as well as send a my chart message. Pt was instructed to call 911 if he develops CVA symptoms. Pt reports he is at baseline in regards to UE strength and coordination. He does not feel as though he needs OT. He would like a referral to ST.    Shanquita Ronning 10/22/2019, 10:58 AM Keene Breath, OTR/L [phone] Phone: [phone] 11:01 AM 01/19/21[facility] [address] [place], [place], [address] Phone: [phone]   Fax:  [phone]

## 2019-10-24 ENCOUNTER — Ambulatory Visit: Payer: 59 | Admitting: Internal Medicine

## 2019-10-24 ENCOUNTER — Encounter: Payer: Self-pay | Admitting: Internal Medicine

## 2019-10-24 ENCOUNTER — Other Ambulatory Visit: Payer: Self-pay

## 2019-10-24 VITALS — BP 160/108 | HR 121 | Temp 99.1°F | Ht 72.0 in | Wt 262.0 lb

## 2019-10-24 DIAGNOSIS — Z8673 Personal history of transient ischemic attack (TIA), and cerebral infarction without residual deficits: Secondary | ICD-10-CM

## 2019-10-24 DIAGNOSIS — I10 Essential (primary) hypertension: Secondary | ICD-10-CM

## 2019-10-24 MED ORDER — ATENOLOL 50 MG PO TABS: 50.0000 mg | ORAL_TABLET | Freq: Every day | ORAL | 3 refills | Status: DC

## 2019-10-24 MED ORDER — AMLODIPINE BESYLATE 10 MG PO TABS: 10.0000 mg | ORAL_TABLET | Freq: Every day | ORAL | 3 refills | Status: DC

## 2019-10-24 NOTE — Progress Notes (Signed)
   Subjective:   Patient ID: MCCALL LOMAX, male    DOB: 08-Nov-1965, 54 y.o.   MRN: [id number]  HPI The patient is a 54 YO man coming in for follow up blood pressure. Home readings 150s/90s mostly. He is taking amlodipine 5 mg daily for about 1 week now. Still taking metoprolol and losartan/hctz. Denies missing doses. Wants to switch back to atenolol which he used to be on years ago. Denies headaches or chest pains. Denies new stroke symptoms. Also was noted to have very high reading at PT and they do not feel he needs PT/OT but would benefit from ST. Needs referral for that.   Review of Systems  Constitutional: Negative.   HENT: Negative.   Eyes: Negative.   Respiratory: Negative for cough, chest tightness and shortness of breath.   Cardiovascular: Negative for chest pain, palpitations and leg swelling.  Gastrointestinal: Negative for abdominal distention, abdominal pain, constipation, diarrhea, nausea and vomiting.  Musculoskeletal: Negative.   Skin: Negative.   Neurological: Negative.   Psychiatric/Behavioral: Negative.     Objective:  Physical Exam Constitutional:      Appearance: He is well-developed.  HENT:     Head: Normocephalic and atraumatic.  Cardiovascular:     Rate and Rhythm: Normal rate and regular rhythm.  Pulmonary:     Effort: Pulmonary effort is normal. No respiratory distress.     Breath sounds: Normal breath sounds. No wheezing or rales.  Abdominal:     General: Bowel sounds are normal. There is no distension.     Palpations: Abdomen is soft.     Tenderness: There is no abdominal tenderness. There is no rebound.  Musculoskeletal:     Cervical back: Normal range of motion.  Skin:    General: Skin is warm and dry.  Neurological:     Mental Status: He is alert and oriented to person, place, and time.     Coordination: Coordination normal.     Vitals:   10/24/19 1357  BP: (!) 160/108  Pulse: (!) 121  Temp: 99.1 F (37.3 C)  TempSrc: Oral  SpO2: 97%    Weight: 262 lb (118.8 kg)  Height: 6' (1.829 m)    This visit occurred during the SARS-CoV-2 public health emergency.  Safety protocols were in place, including screening questions prior to the visit, additional usage of staff PPE, and extensive cleaning of exam room while observing appropriate contact time as indicated for disinfecting solutions.   Assessment & Plan:

## 2019-10-26 NOTE — Assessment & Plan Note (Signed)
Not at goal. Increase amlodipine to 10 mg daily. Resume atenolol and stop metoprolol. Keep losartan/hctz same dosing. Needs follow up.

## 2019-10-26 NOTE — Assessment & Plan Note (Signed)
Referral to speech therapy to help with speech changes.

## 2019-10-29 ENCOUNTER — Encounter: Payer: Self-pay | Admitting: Internal Medicine

## 2019-11-04 ENCOUNTER — Ambulatory Visit: Payer: 59 | Attending: Internal Medicine | Admitting: Speech Pathology

## 2019-11-04 ENCOUNTER — Other Ambulatory Visit: Payer: Self-pay

## 2019-11-04 DIAGNOSIS — R471 Dysarthria and anarthria: Secondary | ICD-10-CM | POA: Diagnosis present

## 2019-11-04 NOTE — Patient Instructions (Signed)
    SLOW LOUD OVER-ENNUNCIATE PAUSE   BUTTERCUP  CATERPILLAR  BASEBALLL PLAYER  [place]  [place]  Speech exercises - do 5x each, x2-3/day SLOW BIG  SAY THE FOLLOWING- make every sound! Red leather, yellow leather     Purple baby carriage    [place] [employer] Proper copper coffee pot Ripe purple cabbage Three free throws [place] [employer] Container, Blue Bulb Flash Message Six Thick Thistles Stick Double Bubble Gum Cinnamon aluminum linoleum Black bugs blood Lovely lemon linament Tying Tape Takes Time A Shifty Salt Shaker   Four floors to cover Unique [place] A Three Toed Tree Toad Knapsack Strap Snap Rubber Baby Buggy Bumpers [place]  [place] Sailors Sailed [place] Which Wrist Watches are Swiss Wrist Watches  SLOW AND BIG - EXAGGERATE YOUR MOUTH, MAKE EACH CONSONANT    Do exercises 20x each, 3x a day - in the mirror, slow and big  1. Alternate pucker and smile - OOO-EEE  2. Open mouth big Ahh-OOO with mouth open big  3. Pucker and move your lips side to side  4. Puff up your cheeks with air BIG - swish air from side to side  5. Press lips together flat and pop them open   6. Pucker and kiss big  7. Hold tongue depressor in your lips (no teeth) out straight - hold for  5 minutes

## 2019-11-06 ENCOUNTER — Other Ambulatory Visit: Payer: Self-pay

## 2019-11-06 NOTE — Therapy (Signed)
[facility] [address] [place], [place], [address] Phone: [phone]   Fax:  [phone]  Speech Language Pathology Evaluation  Patient Details  Name: Anthony Harrington MRN: [id number] Date of Birth: 1965-10-28 Referring Provider (SLP): Dr. Pricilla Holm   Encounter Date: 11/04/2019  End of Session - 11/06/19 0828    Visit Number  1    Number of Visits  17    Date for SLP Re-Evaluation  01/01/20    SLP Start Time  1233    SLP Stop Time   1313    SLP Time Calculation (min)  40 min    Activity Tolerance  Patient tolerated treatment well       Past Medical History:  Diagnosis Date  . CVA (cerebral vascular accident) ([place]) 08/2019  . DDD (degenerative disc disease)    lumbar and cervical  . Hyperlipidemia   . Hypertension   . Seizures ([place])    x 1 in his teen years    Past Surgical History:  Procedure Laterality Date  . FRACTURE SURGERY     left hip  . lumbar spine surgery  2003   microdiskectomy L5-S1 ([facility])    There were no vitals filed for this visit.      SLP Evaluation [facility] - 11/06/19 5643      SLP Visit Information   SLP Received On  11/04/19    Referring Provider (SLP)  Dr. Pricilla Holm    Onset Date  08/22/20    Medical Diagnosis  CVA      Subjective   Subjective  "My speech is not right"    Patient/Family Stated Goal  Speak clearly enough to retrun to work      Pain Assessment   Currently in Pain?  No/denies      General Information   HPI  Anthony Harrington is a 54 y.o. male with medical history significant of seizures; HTN; HLD; and tobacco dependence presenting with slurred speech and facial droop. CT reveals acute CVA in posterior limb of left internal capsule.     Mobility Status  walks independently      Balance Screen   Has the patient fallen in the past 6 months  No    Has the patient had a decrease in activity level because of a fear of falling?   No    Is the patient  reluctant to leave their home because of a fear of falling?   No      Prior Functional Status   Cognitive/Linguistic Baseline  Within functional limits    Type of Home  House     Lives With  Significant other    Available Support  Family    Vocation  Full time employment      Cognition   Overall Cognitive Status  Impaired/Different from baseline      Auditory Comprehension   Overall Auditory Comprehension  Appears within functional limits for tasks assessed      Reading Comprehension   Reading Status  Not tested      Verbal Expression   Overall Verbal Expression  Appears within functional limits for tasks assessed      Written Expression   Dominant Hand  Right    Written Expression  Not tested      Oral Motor/Sensory Function   Overall Oral Motor/Sensory Function  Impaired    Labial ROM  Reduced right    Labial Symmetry  Abnormal symmetry right  Labial Strength  Reduced Right    Labial Sensation  Within Functional Limits    Labial Coordination  Reduced    Lingual ROM  Within Functional Limits    Lingual Strength  Within Functional Limits    Lingual Coordination  WFL    Facial ROM  Within Functional Limits    Velum  Within Functional Limits      Motor Speech   Overall Motor Speech  Impaired    Respiration  Within functional limits    Phonation  Normal    Resonance  Within functional limits    Articulation  Impaired    Level of Impairment  Sentence    Intelligibility  Intelligibility reduced    Word  75-100% accurate    Phrase  75-100% accurate    Sentence  75-100% accurate    Conversation  50-74% accurate    Motor Planning  Witnin functional limits    Effective Techniques  Slow rate;Over-articulate;Pause                      SLP Education - 11/06/19 0827    Education Details  HEP for dysarthria; compensations for dysarthria    Person(s) Educated  Patient;Other (comment)   s/o   Methods  Explanation;Demonstration;Verbal cues;Handout     Comprehension  Verbalized understanding;Returned demonstration;Verbal cues required;Need further instruction       SLP Short Term Goals - 11/06/19 0840      SLP SHORT TERM GOAL #1   Title  Pt will complete HEP for dysarthria with rare min A over 2 sessions    Time  4    Period  Weeks    Status  New      SLP SHORT TERM GOAL #2   Title  Pt will employ compensations for dysarthria to be 100% intellgible in sentence responses in structured speech tasks    Time  4    Period  Weeks    Status  New      SLP SHORT TERM GOAL #3   Title  Pt will report, subjectively, 50% less requests for repetition by family over 2 sessions    Time  4    Period  Weeks    Status  New      SLP SHORT TERM GOAL #5   Title  Pt will utilize compensations during word finding episodes with rare min A over 2 sessions    Time  4    Period  Weeks    Status  New       SLP Long Term Goals - 11/06/19 0841      SLP LONG TERM GOAL #1   Title  Pt will complete HEP for dysarthria with mod I over 2 sessions    Time  8    Period  Weeks    Status  New      SLP LONG TERM GOAL #2   Title  Pt will be 90% intelligible over 20 minute mildly complex conversation with rare min A over 2 sessions    Time  8    Period  Weeks    Status  New      SLP LONG TERM GOAL #3   Title  Pt will be 90% intelligible during 20 minute conversation in a noisy environment with rare min A over 2 sessions    Time  8    Period  Weeks    Status  New       Plan -  11/06/19 0828    Clinical Impression Statement  Anthony Harrington is a 54 y.o. male referred for outpt ST due to ongoing dysarthria s/p CVA. He is accompanied by his s/o, Anthony Harrington. they both report reduced intelligibility. Anthony Harrington states, "I have to ask him to repeat himself constantly." Anthony Harrington becomes frustrated when he is not understood. 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 Today, he presents with moderate flaccid  dysarthria. He is judged to be 75% intelligible in this quiet environment. Anthony Harrington is aware that when he slows his rate, his speech becomes more intelligible. Anthony Harrington also reports some mild word finding difficulties that occur 2-3x a day. He named 11 animals in 1 minute and 3 "m" words in 1 minute, which is below WNL. Anthony Harrington has mild right labial droop, he deinies drooling. Anthony Harrington reports his speech and droop are worse and night and with fatigue. He also notes right UE and LE weakness are worse with fatigue. He does have to type at work. At this time, typing is difficult due to UE coordination/strength. He is electing to no pursue OT at this time. Anthony Harrington and Anthony Harrington deny cognitive changes. Anthony Harrington is managing his medications and finances independently. He is driving and Anthony Harrington feels he is making safe decisions at home. I recommend skilled ST to maximize intelligibility for possible return to work and to reduce pt's frustration.    Speech Therapy Frequency  2x / week    Duration  --   8 weeks o 17 visits   Treatment/Interventions  Language facilitation;Environmental controls;Cueing hierarchy;Oral motor exercises;SLP instruction and feedback;Compensatory strategies;Functional tasks;Internal/external aids;Patient/family education       Patient will benefit from skilled therapeutic intervention in order to improve the following deficits and impairments:   Dysarthria and anarthria    Problem List Patient Active Problem List   Diagnosis Date Noted  . Snoring 09/05/2019  . History of completed stroke 08/23/2019  . Class 2 obesity with body mass index (BMI) of 36.0 to 36.9 in adult 08/23/2019  . EtOH dependence ([facility]) 08/23/2019  . Hypercalcemia 03/15/2014  . DDD (degenerative disc disease), lumbar 12/27/2012  . Routine health maintenance 02/12/2012  . CIGARETTE SMOKER 05/18/2010  . Dyslipidemia 10/12/2007  . Essential hypertension 10/12/2007  . HERNIATED CERVICAL DISC 10/12/2007  . Seizure disorder ([facility])  10/12/2007    Samiksha Pellicano, Radene Journey MS, CCC-SLP 11/06/2019, 8:45 AM  [facility] [address] [place], [place], [address] Phone: [phone]   Fax:  [phone]  Name: Anthony Harrington MRN: [id number] Date of Birth: [birth year]

## 2019-11-11 ENCOUNTER — Other Ambulatory Visit: Payer: Self-pay

## 2019-11-11 ENCOUNTER — Ambulatory Visit: Payer: 59 | Admitting: Speech Pathology

## 2019-11-11 ENCOUNTER — Encounter: Payer: Self-pay | Admitting: Speech Pathology

## 2019-11-11 DIAGNOSIS — R471 Dysarthria and anarthria: Secondary | ICD-10-CM | POA: Diagnosis not present

## 2019-11-11 NOTE — Patient Instructions (Signed)
  Ch, Dj, Th (as is judge) and R are harder for you. Practice words with those sounds and make effort to make them big and correct  Keep up practicing things you would say at work and names of employees and co-workers  Financial risk analyst in conversation talking slow and Audiological scientist [employer] features editor Damage Knowledge Development worker, international aid Apologize Subjective Vegetables

## 2019-11-11 NOTE — Therapy (Signed)
[facility] [address] [place], [place], [address] Phone: [phone]   Fax:  [phone]  Speech Language Pathology Treatment  Patient Details  Name: Anthony Harrington MRN: [id number] Date of Birth: 10-Mar-1966 Referring Provider (SLP): Dr. Hillard Danker   Encounter Date: 11/11/2019  End of Session - 11/11/19 0934    Visit Number  2    Number of Visits  17    Date for SLP Re-Evaluation  01/01/20    SLP Start Time  0842    SLP Stop Time   0921    SLP Time Calculation (min)  39 min    Activity Tolerance  Patient tolerated treatment well       Past Medical History:  Diagnosis Date  . CVA (cerebral vascular accident) ([facility]) 08/2019  . DDD (degenerative disc disease)    lumbar and cervical  . Hyperlipidemia   . Hypertension   . Seizures ([facility])    x 1 in his teen years    Past Surgical History:  Procedure Laterality Date  . FRACTURE SURGERY     left hip  . lumbar spine surgery  2003   microdiskectomy L5-S1 ([place])    There were no vitals filed for this visit.  Subjective Assessment - 11/11/19 0921    Subjective  "I call him from work and make sure he's doing the exercises"    Patient is accompained by:  Family member   s/o, Alinda Money   Currently in Pain?  No/denies            ADULT SLP TREATMENT - 11/11/19 0920      General Information   Behavior/Cognition  Alert;Cooperative;Pleasant mood      Treatment Provided   Treatment provided  Cognitive-Linquistic      Cognitive-Linquistic Treatment   Treatment focused on  Dysarthria;Patient/family/caregiver education    Skilled Treatment  Pt required occasional min A to complete HEP with slow rate and over articulation. Targeted carryover of compensations for dysarthria at sentence level, generating sentence with multisyllabic words with 95% intelligibiliity and occasionall min A to carryover compensatory strategies. In structured task generating 3 sentences,  pt required usual min A and 2 request for repetition. Educated [place] and Alinda Money re: the sounds most difficult for him to produce accurately in connected speech, including /r, ch, dj, th, and sh/. Provided examples of error words. [place] states he can hear when he makes an error in speech. He continues to report frustration when Alinda Money asks him to repeat himself. [place] did generate sentences he would say at work and was 100% intelligible with those when he utilized compensatory strategies. Encouraged him to also practice names of co-workers.       Assessment / Recommendations / Plan   Plan  Continue with current plan of care      Progression Toward Goals   Progression toward goals  Progressing toward goals       SLP Education - 11/11/19 0929    Education Details  HEP and compensatory strategies for dysarthria; added on to HEP    Person(s) Educated  Patient;Other (comment)   s/o Alinda Money   Methods  Explanation;Demonstration;Verbal cues;Handout    Comprehension  Verbalized understanding;Returned demonstration;Need further instruction;Verbal cues required       SLP Short Term Goals - 11/11/19 0933      SLP SHORT TERM GOAL #1   Title  Pt will complete HEP for dysarthria with rare min A over 2 sessions    Time  4    Period  Weeks    Status  On-going      SLP SHORT TERM GOAL #2   Title  Pt will employ compensations for dysarthria to be 100% intellgible in sentence responses in structured speech tasks    Time  4    Period  Weeks    Status  On-going      SLP SHORT TERM GOAL #3   Title  Pt will report, subjectively, 50% less requests for repetition by family over 2 sessions    Time  4    Period  Weeks    Status  On-going      SLP SHORT TERM GOAL #5   Title  Pt will utilize compensations during word finding episodes with rare min A over 2 sessions    Time  4    Period  Weeks    Status  New       SLP Long Term Goals - 11/11/19 2778      SLP LONG TERM GOAL #1   Title  Pt will complete HEP  for dysarthria with mod I over 2 sessions    Time  8    Period  Weeks    Status  On-going      SLP LONG TERM GOAL #2   Title  Pt will be 90% intelligible over 20 minute mildly complex conversation with rare min A over 2 sessions    Time  8    Period  Weeks    Status  On-going      SLP LONG TERM GOAL #3   Title  Pt will be 90% intelligible during 20 minute conversation in a noisy environment with rare min A over 2 sessions    Time  8    Period  Weeks    Status  On-going       Plan - 11/11/19 0930    Clinical Impression Statement  E[place] continues to present with mild to moderate dysarthria affecting his intelligibility and resulting in frustration with communication. HEP completed with min A. Ongoing training for compensations for dysarthria to improve intelligiblity at home and for possible return to work. Continue skilled ST    Speech Therapy Frequency  2x / week    Duration  --   8 weeks or 17 visits   Treatment/Interventions  Language facilitation;Environmental controls;Cueing hierarchy;Oral motor exercises;SLP instruction and feedback;Compensatory strategies;Functional tasks;Internal/external aids;Patient/family education    Potential to Achieve Goals  Good       Patient will benefit from skilled therapeutic intervention in order to improve the following deficits and impairments:   Dysarthria and anarthria    Problem List Patient Active Problem List   Diagnosis Date Noted  . Snoring 09/05/2019  . History of completed stroke 08/23/2019  . Class 2 obesity with body mass index (BMI) of 36.0 to 36.9 in adult 08/23/2019  . EtOH dependence ([place]) 08/23/2019  . Hypercalcemia 03/15/2014  . DDD (degenerative disc disease), lumbar 12/27/2012  . Routine health maintenance 02/12/2012  . CIGARETTE SMOKER 05/18/2010  . Dyslipidemia 10/12/2007  . Essential hypertension 10/12/2007  . HERNIATED CERVICAL DISC 10/12/2007  . Seizure disorder ([place]) 10/12/2007    [place] Catlin, Annye Rusk MS,  CCC-SLP 11/11/2019, 9:35 AM  [facility] [address] [place], [place], [address] Phone: [phone]   Fax:  [phone]   Name: HALDEN PHEGLEY MRN: [id number] Date of Birth: [birth year]

## 2019-11-13 ENCOUNTER — Ambulatory Visit: Payer: 59 | Admitting: Speech Pathology

## 2019-11-13 ENCOUNTER — Other Ambulatory Visit: Payer: Self-pay

## 2019-11-13 ENCOUNTER — Encounter: Payer: Self-pay | Admitting: Speech Pathology

## 2019-11-13 DIAGNOSIS — R471 Dysarthria and anarthria: Secondary | ICD-10-CM | POA: Diagnosis not present

## 2019-11-13 NOTE — Patient Instructions (Signed)
  When you hear a slur or mispronunciation, correct it with over articulated speech - talk big and slow down. Remember at work you will have on a mask, so others won't see your mouth. You will have to talk really big, especially when you get tired.   If a word is extra hard, break it up into syllables.  Mayer Camel Shawn Laverda Page  We are coming with some furniture  We will be there at 3:00 to deliver the furniture.  Georges Lynch wants you to label those lamps.  Mellody Dance, would you and Arlys John load the chairs?  I need to call the [facility] [employer] store.  We will deliver to [facility] on Thursday.  I work for [employer] in [place].  The armoires and Air traffic controller are out for delivery to [employer].   Our furniture in manufactured in [place] and [place]  Right now we are getting our furniture from local sources.   Brenton Grills is my Merchandiser, retail. She is the owner.   Should we get [employer] or [employer]?  Noland Fordyce and Tink are [employer].  Get off the counter, Gaby.

## 2019-11-13 NOTE — Therapy (Signed)
[facility] [address] [place], [place], [address] Phone: [phone]   Fax:  [phone]  Speech Language Pathology Treatment  Patient Details  Name: Anthony Harrington MRN: [id number] Date of Birth: 08/28/1966 Referring Provider (SLP): Dr. Hillard Danker   Encounter Date: 11/13/2019  End of Session - 11/13/19 1215    Visit Number  3    Number of Visits  17    Date for SLP Re-Evaluation  01/01/20    SLP Start Time  0845    SLP Stop Time   0928    SLP Time Calculation (min)  43 min    Activity Tolerance  Patient tolerated treatment well       Past Medical History:  Diagnosis Date  . CVA (cerebral vascular accident) ([facility]) 08/2019  . DDD (degenerative disc disease)    lumbar and cervical  . Hyperlipidemia   . Hypertension   . Seizures ([facility])    x 1 in his teen years    Past Surgical History:  Procedure Laterality Date  . FRACTURE SURGERY     left hip  . lumbar spine surgery  2003   microdiskectomy L5-S1 ([place])    There were no vitals filed for this visit.  Subjective Assessment - 11/13/19 1203    Subjective  "I did the practicing"    Currently in Pain?  No/denies            ADULT SLP TREATMENT - 11/13/19 0940      General Information   Behavior/Cognition  Alert;Cooperative;Pleasant mood      Treatment Provided   Treatment provided  Cognitive-Linquistic      Cognitive-Linquistic Treatment   Treatment focused on  Dysarthria;Patient/family/caregiver education    Skilled Treatment  Pt completed portions of HEP with occasionla min A for slower rate and over articulation. Structured speech tasks with multisyllabic words at sentence level required ususal min to mod A for pt to ID and correct mis-articulation or slurred speech. Occasional veral cues and modeling to slow rate. Ongoing generation of persoanlly relevant sentences with error sounds (sh, ch, r, th and consonant clusters) generated - see  pt instructions. Pt completed these with occasional min A for intelligiblity. Social responses and greetings WNL today. Daniil reports less requests for repetition from s/o.       Assessment / Recommendations / Plan   Plan  Continue with current plan of care      Progression Toward Goals   Progression toward goals  Progressing toward goals       SLP Education - 11/13/19 1213    Education Details  HEP, compensations for dysarthria; listens for and correct errors    Person(s) Educated  Patient    Methods  Explanation;Demonstration;Verbal cues;Handout    Comprehension  Verbalized understanding;Returned demonstration;Verbal cues required;Need further instruction       SLP Short Term Goals - 11/13/19 1214      SLP SHORT TERM GOAL #1   Title  Pt will complete HEP for dysarthria with rare min A over 2 sessions    Time  4    Period  Weeks    Status  On-going      SLP SHORT TERM GOAL #2   Title  Pt will employ compensations for dysarthria to be 100% intellgible in sentence responses in structured speech tasks    Time  4    Period  Weeks    Status  On-going      SLP SHORT TERM  GOAL #3   Title  Pt will report, subjectively, 50% less requests for repetition by family over 2 sessions    Time  4    Period  Weeks    Status  On-going      SLP SHORT TERM GOAL #5   Title  Pt will utilize compensations during word finding episodes with rare min A over 2 sessions    Time  4    Period  Weeks    Status  New       SLP Long Term Goals - 11/13/19 1215      SLP LONG TERM GOAL #1   Title  Pt will complete HEP for dysarthria with mod I over 2 sessions    Time  8    Period  Weeks    Status  On-going      SLP LONG TERM GOAL #2   Title  Pt will be 90% intelligible over 20 minute mildly complex conversation with rare min A over 2 sessions    Time  8    Period  Weeks    Status  On-going      SLP LONG TERM GOAL #3   Title  Pt will be 90% intelligible during 20 minute conversation in a noisy  environment with rare min A over 2 sessions    Time  8    Period  Weeks    Status  On-going       Plan - 11/13/19 1213    Clinical Impression Statement  Khamani continues to present with mild to moderate dysarthria affecting his intelligibility and resulting in frustration with communication. HEP completed with min A. Ongoing training for compensations for dysarthria to improve intelligiblity at home and for possible return to work. Continue skilled ST    Speech Therapy Frequency  2x / week    Duration  --   8 weeks or 17 visits   Treatment/Interventions  Language facilitation;Environmental controls;Cueing hierarchy;Oral motor exercises;SLP instruction and feedback;Compensatory strategies;Functional tasks;Internal/external aids;Patient/family education    Potential to Achieve Goals  Good       Patient will benefit from skilled therapeutic intervention in order to improve the following deficits and impairments:   Dysarthria and anarthria    Problem List Patient Active Problem List   Diagnosis Date Noted  . Snoring 09/05/2019  . History of completed stroke 08/23/2019  . Class 2 obesity with body mass index (BMI) of 36.0 to 36.9 in adult 08/23/2019  . EtOH dependence ([place]) 08/23/2019  . Hypercalcemia 03/15/2014  . DDD (degenerative disc disease), lumbar 12/27/2012  . Routine health maintenance 02/12/2012  . CIGARETTE SMOKER 05/18/2010  . Dyslipidemia 10/12/2007  . Essential hypertension 10/12/2007  . HERNIATED CERVICAL DISC 10/12/2007  . Seizure disorder ([place]) 10/12/2007    Gwyndolyn Guilford, Annye Rusk  MS, CCC-SLP 11/13/2019, 12:15 PM  [facility] [address] [place], [place], [address] Phone: [phone]   Fax:  [phone]   Name: AUTHER LYERLY MRN: [id number] Date of Birth: [birth year]

## 2019-11-18 ENCOUNTER — Ambulatory Visit: Payer: 59 | Admitting: Speech Pathology

## 2019-11-18 ENCOUNTER — Other Ambulatory Visit: Payer: Self-pay

## 2019-11-18 DIAGNOSIS — R471 Dysarthria and anarthria: Secondary | ICD-10-CM | POA: Diagnosis not present

## 2019-11-18 NOTE — Therapy (Signed)
[facility] [address] [place], [place], [address] Phone: [phone]   Fax:  [phone]  Speech Language Pathology Treatment  Patient Details  Name: Anthony Harrington MRN: [id number] Date of Birth: 01/05/1966 Referring Provider (SLP): Dr. Hillard Danker   Encounter Date: 11/18/2019  End of Session - 11/18/19 1252    Visit Number  4    Number of Visits  17    Date for SLP Re-Evaluation  01/01/20    SLP Start Time  1100    SLP Stop Time   1143    SLP Time Calculation (min)  43 min    Activity Tolerance  Patient tolerated treatment well       Past Medical History:  Diagnosis Date  . CVA (cerebral vascular accident) (HCC) 08/2019  . DDD (degenerative disc disease)    lumbar and cervical  . Hyperlipidemia   . Hypertension   . Seizures (HCC)    x 1 in his teen years    Past Surgical History:  Procedure Laterality Date  . FRACTURE SURGERY     left hip  . lumbar spine surgery  2003   microdiskectomy L5-S1 ([place])    There were no vitals filed for this visit.  Subjective Assessment - 11/18/19 1105    Subjective  "It's going."    Currently in Pain?  No/denies            ADULT SLP TREATMENT - 11/18/19 1105      General Information   Behavior/Cognition  Alert;Cooperative;Pleasant mood      Treatment Provided   Treatment provided  Cognitive-Linquistic      Pain Assessment   Pain Assessment  No/denies pain      Cognitive-Linquistic Treatment   Treatment focused on  Dysarthria    Skilled Treatment  Patient demo'd portions of HEP with occasional min cues necessary for slower rate, over articulation. Pausing effective to reduce misarticulations of difficult sounds (dj, ch, sh, clusters). SLP worked with pt on carryover of compensations into simple description tasks [employer] characters), and simple conversation (sci fi shows) with occasional verbal cues and demonstration/modeling necessary. Pt to sit  down with s/o and have a conversation using dysarthria compensations after practicing his HEP.      Assessment / Recommendations / Plan   Plan  Continue with current plan of care      Progression Toward Goals   Progression toward goals  Progressing toward goals         SLP Short Term Goals - 11/18/19 1254      SLP SHORT TERM GOAL #1   Title  Pt will complete HEP for dysarthria with rare min A over 2 sessions    Time  3    Period  Weeks    Status  On-going      SLP SHORT TERM GOAL #2   Title  Pt will employ compensations for dysarthria to be 100% intellgible in sentence responses in structured speech tasks    Time  3    Period  Weeks    Status  On-going      SLP SHORT TERM GOAL #3   Title  Pt will report, subjectively, 50% less requests for repetition by family over 2 sessions    Time  3    Period  Weeks    Status  On-going      SLP SHORT TERM GOAL #5   Title  Pt will utilize compensations during word finding episodes  with rare min A over 2 sessions    Time  3    Period  Weeks    Status  On-going       SLP Long Term Goals - 11/18/19 1254      SLP LONG TERM GOAL #1   Title  Pt will complete HEP for dysarthria with mod I over 2 sessions    Time  7    Period  Weeks    Status  On-going      SLP LONG TERM GOAL #2   Title  Pt will be 90% intelligible over 20 minute mildly complex conversation with rare min A over 2 sessions    Time  7    Period  Weeks    Status  On-going      SLP LONG TERM GOAL #3   Title  Pt will be 90% intelligible during 20 minute conversation in a noisy environment with rare min A over 2 sessions    Time  7    Period  Weeks    Status  On-going       Plan - 11/18/19 1253    Clinical Impression Statement  Anthony Harrington continues to present with mild to moderate dysarthria affecting his intelligibility and resulting in frustration with communication. HEP completed with min A. Continue skilled ST for ongoing training for compensations for dysarthria to  improve intelligiblity at home and for possible return to work    Speech Therapy Frequency  2x / week    Duration  --   8 weeks or 17 visits   Treatment/Interventions  Language facilitation;Environmental controls;Cueing hierarchy;Oral motor exercises;SLP instruction and feedback;Compensatory strategies;Functional tasks;Internal/external aids;Patient/family education    Potential to Achieve Goals  Good       Patient will benefit from skilled therapeutic intervention in order to improve the following deficits and impairments:   Dysarthria and anarthria    Problem List Patient Active Problem List   Diagnosis Date Noted  . Snoring 09/05/2019  . History of completed stroke 08/23/2019  . Class 2 obesity with body mass index (BMI) of 36.0 to 36.9 in adult 08/23/2019  . EtOH dependence ([place]) 08/23/2019  . Hypercalcemia 03/15/2014  . DDD (degenerative disc disease), lumbar 12/27/2012  . Routine health maintenance 02/12/2012  . CIGARETTE SMOKER 05/18/2010  . Dyslipidemia 10/12/2007  . Essential hypertension 10/12/2007  . HERNIATED CERVICAL DISC 10/12/2007  . Seizure disorder [facility]) 10/12/2007   Deneise Lever, [place], CCC-SLP Speech-Language Pathologist  Aliene Altes 11/18/2019, 12:55 PM  [facility] [address] [place], [place], [address] Phone: [phone]   Fax:  [phone]   Name: Anthony Harrington MRN: [id number] Date of Birth: 01-Jul-1966

## 2019-11-20 ENCOUNTER — Ambulatory Visit: Payer: 59 | Admitting: Speech Pathology

## 2019-11-20 ENCOUNTER — Encounter: Payer: Self-pay | Admitting: Speech Pathology

## 2019-11-20 ENCOUNTER — Other Ambulatory Visit: Payer: Self-pay

## 2019-11-20 DIAGNOSIS — R471 Dysarthria and anarthria: Secondary | ICD-10-CM

## 2019-11-20 NOTE — Therapy (Signed)
[facility] [address] [place], [place], [address] Phone: [phone]   Fax:  [phone]  Speech Language Pathology Treatment  Patient Details  Name: Anthony Harrington MRN: [id number] Date of Birth: [birth year] Referring Provider (SLP): Dr. Pricilla Holm   Encounter Date: 11/20/2019  End of Session - 11/20/19 1322    Visit Number  5    Number of Visits  17    Date for SLP Re-Evaluation  01/01/20    SLP Start Time  10    SLP Stop Time   1312    SLP Time Calculation (min)  42 min    Activity Tolerance  Patient tolerated treatment well       Past Medical History:  Diagnosis Date  . CVA (cerebral vascular accident) ([place]) 08/2019  . DDD (degenerative disc disease)    lumbar and cervical  . Hyperlipidemia   . Hypertension   . Seizures ([place])    x 1 in his teen years    Past Surgical History:  Procedure Laterality Date  . FRACTURE SURGERY     left hip  . lumbar spine surgery  2003   microdiskectomy L5-S1 ([place])    There were no vitals filed for this visit.  Subjective Assessment - 11/20/19 1242    Subjective  "it's the same" re: speech practice    Currently in Pain?  No/denies            ADULT SLP TREATMENT - 11/20/19 1247      General Information   Behavior/Cognition  Alert;Cooperative;Pleasant mood      Treatment Provided   Treatment provided  Cognitive-Linquistic      Cognitive-Linquistic Treatment   Treatment focused on  Dysarthria;Patient/family/caregiver education    Skilled Treatment  Pt demonstrated HEP with occasional min A for reduced rate. Structured speech task generating sentneces with multisyllabic words with occasional min A to carryover compensations for dysarthria. In simple conversation Anthony Harrington requires ongoing cues to carryover of slow rate and over articulation in conversation. Handouts on stroke education, risk factor and s/s of CVA provided. Generated more work related  sentences for HEP.      Assessment / Recommendations / Plan   Plan  Continue with current plan of care      Progression Toward Goals   Progression toward goals  Progressing toward goals       SLP Education - 11/20/19 1318    Education Details  Compensations for dysarthira; practice compensations in conversation with Anthony Harrington) Educated  Patient    Methods  Explanation;Demonstration;Verbal cues;Handout    Comprehension  Verbalized understanding;Returned demonstration;Verbal cues required       SLP Short Term Goals - 11/20/19 1321      SLP SHORT TERM GOAL #1   Title  Pt will complete HEP for dysarthria with rare min A over 2 sessions    Time  2    Period  Weeks    Status  On-going      SLP SHORT TERM GOAL #2   Title  Pt will employ compensations for dysarthria to be 100% intellgible in sentence responses in structured speech tasks    Time  2    Period  Weeks    Status  On-going      SLP SHORT TERM GOAL #3   Title  Pt will report, subjectively, 50% less requests for repetition by family over 2 sessions    Time  2    Period  Weeks    Status  On-going      SLP SHORT TERM GOAL #5   Title  Pt will utilize compensations during word finding episodes with rare min A over 2 sessions    Time  3    Period  Weeks    Status  On-going       SLP Long Term Goals - 11/20/19 1322      SLP LONG TERM GOAL #1   Title  Pt will complete HEP for dysarthria with mod I over 2 sessions    Time  6    Period  Weeks    Status  On-going      SLP LONG TERM GOAL #2   Title  Pt will be 90% intelligible over 20 minute mildly complex conversation with rare min A over 2 sessions    Time  6    Period  Weeks    Status  On-going      SLP LONG TERM GOAL #3   Title  Pt will be 90% intelligible during 20 minute conversation in a noisy environment with rare min A over 2 sessions    Time  6    Period  Weeks    Status  On-going       Plan - 11/20/19 1321    Clinical Impression Statement   Anthony Harrington continues to present with mild to moderate dysarthria affecting his intelligibility and resulting in frustration with communication. HEP completed with min A. Continue skilled ST for ongoing training for compensations for dysarthria to improve intelligiblity at home and for possible return to work    Speech Therapy Frequency  2x / week    Duration  --   8 weeks or 17 visits   Treatment/Interventions  Language facilitation;Environmental controls;Cueing hierarchy;Oral motor exercises;SLP instruction and feedback;Compensatory strategies;Functional tasks;Internal/external aids;Patient/family education    Potential to Achieve Goals  Good       Patient will benefit from skilled therapeutic intervention in order to improve the following deficits and impairments:   Dysarthria and anarthria    Problem List Patient Active Problem List   Diagnosis Date Noted  . Snoring 09/05/2019  . History of completed stroke 08/23/2019  . Class 2 obesity with body mass index (BMI) of 36.0 to 36.9 in adult 08/23/2019  . EtOH dependence (HCC) 08/23/2019  . Hypercalcemia 03/15/2014  . DDD (degenerative disc disease), lumbar 12/27/2012  . Routine health maintenance 02/12/2012  . CIGARETTE SMOKER 05/18/2010  . Dyslipidemia 10/12/2007  . Essential hypertension 10/12/2007  . HERNIATED CERVICAL DISC 10/12/2007  . Seizure disorder (HCC) 10/12/2007    Annaleise Burger, Radene Journey MS, CCC-SLP 11/20/2019, 1:23 PM  [facility] [address] [place], [place], [address] Phone: [phone]   Fax:  [phone]   Name: Anthony Harrington MRN: [id number] Date of Birth: 1966-01-06

## 2019-11-20 NOTE — Patient Instructions (Signed)
  The chair is upholstered in [place].  Reach for the chaise covered in chintz.   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

## 2019-11-25 ENCOUNTER — Encounter: Payer: Self-pay | Admitting: Speech Pathology

## 2019-11-25 ENCOUNTER — Ambulatory Visit: Payer: 59 | Admitting: Speech Pathology

## 2019-11-25 ENCOUNTER — Other Ambulatory Visit: Payer: Self-pay

## 2019-11-25 DIAGNOSIS — R471 Dysarthria and anarthria: Secondary | ICD-10-CM | POA: Diagnosis not present

## 2019-11-25 NOTE — Therapy (Signed)
[facility] [address] [place], [place], [address] Phone: [phone]   Fax:  [phone]  Speech Language Pathology Treatment  Patient Details  Name: Anthony Harrington MRN: [id number] Date of Birth: 04-07-1966 Referring Provider (SLP): Dr. Pricilla Holm   Encounter Date: 11/25/2019  End of Session - 11/25/19 1108    Visit Number  6    Number of Visits  17    Date for SLP Re-Evaluation  01/01/20    SLP Start Time  0935    SLP Stop Time   2778    SLP Time Calculation (min)  39 min    Activity Tolerance  Patient tolerated treatment well       Past Medical History:  Diagnosis Date  . CVA (cerebral vascular accident) ([place]) 08/2019  . DDD (degenerative disc disease)    lumbar and cervical  . Hyperlipidemia   . Hypertension   . Seizures ([place])    x 1 in his teen years    Past Surgical History:  Procedure Laterality Date  . FRACTURE SURGERY     left hip  . lumbar spine surgery  2003   microdiskectomy L5-S1 ([place])    There were no vitals filed for this visit.  Subjective Assessment - 11/25/19 1008    Subjective  "It's OK, not really better    Currently in Pain?  No/denies            ADULT SLP TREATMENT - 11/25/19 0946      General Information   Behavior/Cognition  Alert;Cooperative;Pleasant mood      Treatment Provided   Treatment provided  Cognitive-Linquistic      Cognitive-Linquistic Treatment   Treatment focused on  Dysarthria;Patient/family/caregiver education    Skilled Treatment  Portions of HEP demonstrated with rare min A for slow rate and over articulation.  Anthony Harrington reports he "gets reminders" from s/o to carryover compensations at home. He has not spoken with anyone besides s/o. I encouraged him to call in a food order or call a store to ask a question to practice generalization of compensations, as well as improve confidence using strategies outside of home.  Anthony Harrington carries over  compensations to be 90% intelligible with occasional min A. In 15 minute simple conversation, I requested 2 repetitions due to reduced intelligibility. Occasional min A to carryover compensations in conversation level.       Assessment / Recommendations / Plan   Plan  Continue with current plan of care      Progression Toward Goals   Progression toward goals  Progressing toward goals       SLP Education - 11/25/19 1104    Education Details  Make effort to practice compensations for dysarthria with other people (other than s/o and make some phone calls), Added oral reading to HEP    Person(s) Educated  Patient    Methods  Explanation;Demonstration;Handout;Verbal cues    Comprehension  Verbalized understanding;Returned demonstration;Verbal cues required;Need further instruction       SLP Short Term Goals - 11/25/19 1107      SLP SHORT TERM GOAL #1   Title  Pt will complete HEP for dysarthria with rare min A over 2 sessions    Baseline  11/25/19;    Time  1    Period  Weeks    Status  On-going      SLP SHORT TERM GOAL #2   Title  Pt will employ compensations for dysarthria to be 100% intellgible in sentence  responses in structured speech tasks    Baseline  11/25/19;    Time  1    Period  Weeks    Status  On-going      SLP SHORT TERM GOAL #3   Title  Pt will report, subjectively, 50% less requests for repetition by family over 2 sessions    Time  1    Period  Weeks    Status  On-going      SLP SHORT TERM GOAL #5   Title  Pt will utilize compensations during word finding episodes with rare min A over 2 sessions    Time  3    Period  Weeks    Status  On-going       SLP Long Term Goals - 11/25/19 1108      SLP LONG TERM GOAL #1   Title  Pt will complete HEP for dysarthria with mod I over 2 sessions    Time  5    Period  Weeks    Status  On-going      SLP LONG TERM GOAL #2   Title  Pt will be 90% intelligible over 20 minute mildly complex conversation with rare min A  over 2 sessions    Time  5    Period  Weeks    Status  On-going      SLP LONG TERM GOAL #3   Title  Pt will be 90% intelligible during 20 minute conversation in a noisy environment with rare min A over 2 sessions    Time  5    Period  Weeks    Status  On-going       Plan - 11/25/19 1105    Clinical Impression Statement  Anthony Harrington continues to present with mild to moderate dysarthria affecting his intelligibility and resulting in frustration with communication. HEP completed with min A. Progressing somewhat slowly with reluctance to communicate with others outside of the home Continue skilled ST for ongoing training for compensations for dysarthria, incluidng carryover outside of home,  to improve intelligiblity at home and for possible return to work    Speech Therapy Frequency  2x / week    Duration  --   8 weeks or 17 visits   Treatment/Interventions  Language facilitation;Environmental controls;Cueing hierarchy;Oral motor exercises;SLP instruction and feedback;Compensatory strategies;Functional tasks;Internal/external aids;Patient/family education    Potential to Achieve Goals  Good       Patient will benefit from skilled therapeutic intervention in order to improve the following deficits and impairments:   Dysarthria and anarthria    Problem List Patient Active Problem List   Diagnosis Date Noted  . Snoring 09/05/2019  . History of completed stroke 08/23/2019  . Class 2 obesity with body mass index (BMI) of 36.0 to 36.9 in adult 08/23/2019  . EtOH dependence (HCC) 08/23/2019  . Hypercalcemia 03/15/2014  . DDD (degenerative disc disease), lumbar 12/27/2012  . Routine health maintenance 02/12/2012  . CIGARETTE SMOKER 05/18/2010  . Dyslipidemia 10/12/2007  . Essential hypertension 10/12/2007  . HERNIATED CERVICAL DISC 10/12/2007  . Seizure disorder (HCC) 10/12/2007    Tamira Ryland, Radene Journey  MS, CCC-SLP 11/25/2019, 11:09 AM  [facility] [address] [place], [place], [address] Phone: [phone]   Fax:  [phone]   Name: Anthony Harrington MRN: [id number] Date of Birth: 02/21/1966

## 2019-11-25 NOTE — Patient Instructions (Signed)
   Read aloud 5 minutes 3x a day focusing on your slow rate and over enunication  Try to find ways to practice your slow big speech with some people other than Alinda Money. For example, place a phone order for food, call a good friend, ask a question in a store etc  Keep practicing slow rate and over enunciation in conversations with Alinda Money  Keep track of how many times Alinda Money doesn't understand your speech - not how often he reminds you to speak slowly, but only when he truly doesn't understand what you've said

## 2019-11-29 ENCOUNTER — Encounter: Payer: Self-pay | Admitting: Internal Medicine

## 2019-12-02 ENCOUNTER — Ambulatory Visit: Payer: 59 | Attending: Internal Medicine | Admitting: Speech Pathology

## 2019-12-02 ENCOUNTER — Encounter: Payer: Self-pay | Admitting: Speech Pathology

## 2019-12-02 ENCOUNTER — Other Ambulatory Visit: Payer: Self-pay

## 2019-12-02 DIAGNOSIS — R471 Dysarthria and anarthria: Secondary | ICD-10-CM

## 2019-12-02 NOTE — Therapy (Signed)
[facility] [address] [place], [place], [address] Phone: [phone]   Fax:  [phone]  Speech Language Pathology Treatment  Patient Details  Name: Anthony Harrington MRN: [id number] Date of Birth: 1966-07-25 Referring Provider (SLP): Dr. Hillard Danker   Encounter Date: 12/02/2019  End of Session - 12/02/19 1406    Visit Number  7    Number of Visits  17    Date for SLP Re-Evaluation  01/01/20    SLP Start Time  1317    SLP Stop Time   1400    SLP Time Calculation (min)  43 min    Activity Tolerance  Patient tolerated treatment well       Past Medical History:  Diagnosis Date  . CVA (cerebral vascular accident) (HCC) 08/2019  . DDD (degenerative disc disease)    lumbar and cervical  . Hyperlipidemia   . Hypertension   . Seizures (HCC)    x 1 in his teen years    Past Surgical History:  Procedure Laterality Date  . FRACTURE SURGERY     left hip  . lumbar spine surgery  2003   microdiskectomy L5-S1 ([place])    There were no vitals filed for this visit.  Subjective Assessment - 12/02/19 1333    Subjective  "I think my speech is better" "I return to work on Thursday"    Currently in Pain?  No/denies            ADULT SLP TREATMENT - 12/02/19 1334      General Information   Behavior/Cognition  Alert;Cooperative;Pleasant mood      Treatment Provided   Treatment provided  Cognitive-Linquistic      Cognitive-Linquistic Treatment   Treatment focused on  Dysarthria;Patient/family/caregiver education    Skilled Treatment  Pt is returning to work on Thursday.          SLP Short Term Goals - 12/02/19 1405      SLP SHORT TERM GOAL #1   Title  Pt will complete HEP for dysarthria with rare min A over 2 sessions    Baseline  11/25/19; 12/02/19    Time  1    Period  Weeks    Status  Achieved      SLP SHORT TERM GOAL #2   Title  Pt will employ compensations for dysarthria to be 100% intellgible  in sentence responses in structured speech tasks    Baseline  11/25/19; 12/02/19    Time  1    Period  Weeks    Status  Achieved      SLP SHORT TERM GOAL #3   Title  Pt will report, subjectively, 50% less requests for repetition by family over 2 sessions    Time  1    Period  Weeks    Status  Achieved      SLP SHORT TERM GOAL #5   Title  Pt will utilize compensations during word finding episodes with rare min A over 2 sessions    Time  3    Period  Weeks    Status  On-going       SLP Long Term Goals - 12/02/19 1405      SLP LONG TERM GOAL #1   Title  Pt will complete HEP for dysarthria with mod I over 2 sessions    Baseline  12/02/10    Time  4    Period  Weeks    Status  On-going  SLP LONG TERM GOAL #2   Title  Pt will be 90% intelligible over 20 minute mildly complex conversation with rare min A over 2 sessions    Baseline  12/02/19    Time  4    Period  Weeks    Status  On-going      SLP LONG TERM GOAL #3   Title  Pt will be 90% intelligible during 20 minute conversation in a noisy environment with rare min A over 2 sessions    Time  4    Period  Weeks    Status  On-going       Plan - 12/02/19 1401    Clinical Impression Statement  Khayri continues to present with mild  dysarthria affecting his intelligibility and resulting in frustration with communication. HEP completed with min A. Progressing  with improving intellgibility. Calel is returning to work this Thursday. We agree to reduce frequency to 1x a week. Continue skilled ST for ongoing training for compensations for dysarthria, incluidng carryover outside of home,  to improve intelligiblity at home and for possible return to work    Speech Therapy Frequency  1x /week    Duration  --   8 weeks or 17 visits   Treatment/Interventions  Language facilitation;Environmental controls;Cueing hierarchy;Oral motor exercises;SLP instruction and feedback;Compensatory strategies;Functional tasks;Internal/external  aids;Patient/family education    Potential to Achieve Goals  Good       Patient will benefit from skilled therapeutic intervention in order to improve the following deficits and impairments:   Dysarthria and anarthria    Problem List Patient Active Problem List   Diagnosis Date Noted  . Snoring 09/05/2019  . History of completed stroke 08/23/2019  . Class 2 obesity with body mass index (BMI) of 36.0 to 36.9 in adult 08/23/2019  . EtOH dependence ([place]) 08/23/2019  . Hypercalcemia 03/15/2014  . DDD (degenerative disc disease), lumbar 12/27/2012  . Routine health maintenance 02/12/2012  . CIGARETTE SMOKER 05/18/2010  . Dyslipidemia 10/12/2007  . Essential hypertension 10/12/2007  . HERNIATED CERVICAL DISC 10/12/2007  . Seizure disorder ([place]) 10/12/2007    Devlynn Knoff, Annye Rusk MS, CCC-SLP 12/02/2019, 2:07 PM  [facility] [address] [place], [place], [address] Phone: [phone]   Fax:  [phone]   Name: LAVERE STORK MRN: [id number] Date of Birth: 04/14/1966

## 2019-12-02 NOTE — Patient Instructions (Signed)
   Think about how you want to respond when everyone at work wants to know how you are doing. Maybe something like,: The stroke only affected my speech. It's getting better, but I may sound different at times.   Present yourself positively - people have different ideas of what a stroke does/looks like  Practice greeting your co-workers with their names  Think about if you need some quiet rest during lunch - the 1st few days you may want to eat in your car or quiet office to let your brain rest (set an alarm on your phone)  In the morning before work, run through some of the work phrases slow and big to get warmed up   You may have a little more physical or brain fatigue during work than you are used to  Let people know not to interrupt you when you need to concentrate on something   Let Alinda Money know that you may not have a lot of energy after work for the 1st couple of weeks. If you go home and nap for 30 minutes it's OK. Same for weekends after your 1st few 40 hour weeks

## 2019-12-04 ENCOUNTER — Encounter: Payer: 59 | Admitting: Speech Pathology

## 2019-12-09 ENCOUNTER — Encounter: Payer: 59 | Admitting: Speech Pathology

## 2019-12-11 ENCOUNTER — Encounter: Payer: Self-pay | Admitting: Speech Pathology

## 2019-12-11 ENCOUNTER — Other Ambulatory Visit: Payer: Self-pay

## 2019-12-11 ENCOUNTER — Ambulatory Visit: Payer: 59 | Admitting: Speech Pathology

## 2019-12-11 DIAGNOSIS — R471 Dysarthria and anarthria: Secondary | ICD-10-CM

## 2019-12-11 NOTE — Therapy (Signed)
[facility] [address] [place], [place], [address] Phone: [phone]   Fax:  [phone]  Speech Language Pathology Treatment  Patient Details  Name: Anthony Harrington MRN: [id number] Date of Birth: 08-27-1966 Referring Provider (SLP): Dr. Hillard Danker   Encounter Date: 12/11/2019  End of Session - 12/11/19 1524    Visit Number  8    Number of Visits  17    Date for SLP Re-Evaluation  01/01/20    SLP Start Time  1232    SLP Stop Time   1309    SLP Time Calculation (min)  37 min    Activity Tolerance  Patient tolerated treatment well       Past Medical History:  Diagnosis Date  . CVA (cerebral vascular accident) (HCC) 08/2019  . DDD (degenerative disc disease)    lumbar and cervical  . Hyperlipidemia   . Hypertension   . Seizures (HCC)    x 1 in his teen years    Past Surgical History:  Procedure Laterality Date  . FRACTURE SURGERY     left hip  . lumbar spine surgery  2003   microdiskectomy L5-S1 ([place])    There were no vitals filed for this visit.  Subjective Assessment - 12/11/19 1237    Subjective  "They understand me at work"    Currently in Pain?  No/denies            ADULT SLP TREATMENT - 12/11/19 1238      General Information   Behavior/Cognition  Alert;Cooperative;Pleasant mood      Treatment Provided   Treatment provided  Cognitive-Linquistic      Cognitive-Linquistic Treatment   Treatment focused on  Dysarthria;Patient/family/caregiver education    Skilled Treatment  Marissa reports success communicating at work and that others understand him and he had minimal requests for repeats. Devario reports fatigue after work. They play music and converse. I explained this back ground noise and processing rapid conversation is harder for his brain to process and filter out since his CVA which may affect his fatigue level.  Dorance carries over slow rate and over articulation in HEP and  structured speech tasks with rare min A. Conversation intelligible with known context and required 2 requests for repair over 15 minutes. Informal line diagram with Tyner judging where his speech is compared to immediately after the CVA to normal prior to CVA on line, Antaeus rates his speech at 50% back to normal      Assessment / Recommendations / Plan   Plan  Continue with current plan of care      Progression Toward Goals   Progression toward goals  Progressing toward goals       SLP Education - 12/11/19 1521    Education Details  continue HEP, oral reading and using compensatory strategies, energy convservation s/p CVA    Person(s) Educated  Patient    Methods  Explanation;Demonstration;Verbal cues;Handout    Comprehension  Verbalized understanding;Returned demonstration;Verbal cues required       SLP Short Term Goals - 12/11/19 1523      SLP SHORT TERM GOAL #1   Title  Pt will complete HEP for dysarthria with rare min A over 2 sessions    Baseline  11/25/19; 12/02/19    Time  1    Period  Weeks    Status  Achieved      SLP SHORT TERM GOAL #2   Title  Pt will employ compensations for  dysarthria to be 100% intellgible in sentence responses in structured speech tasks    Baseline  11/25/19; 12/02/19    Time  1    Period  Weeks    Status  Achieved      SLP SHORT TERM GOAL #3   Title  Pt will report, subjectively, 50% less requests for repetition by family over 2 sessions    Time  1    Period  Weeks    Status  Achieved      SLP SHORT TERM GOAL #5   Title  Pt will utilize compensations during word finding episodes with rare min A over 2 sessions    Time  3    Period  Weeks    Status  On-going       SLP Long Term Goals - 12/11/19 1523      SLP LONG TERM GOAL #1   Title  Pt will complete HEP for dysarthria with mod I over 2 sessions    Baseline  12/02/10    Time  3    Period  Weeks    Status  Achieved      SLP LONG TERM GOAL #2   Title  Pt will be 90% intelligible over 20  minute mildly complex conversation with rare min A over 2 sessions    Baseline  12/02/19, 12/11/19    Time  3    Period  Weeks    Status  On-going      SLP LONG TERM GOAL #3   Title  Pt will be 90% intelligible during 20 minute conversation in a noisy environment with rare min A over 2 sessions    Time  3    Period  Weeks    Status  On-going       Plan - 12/11/19 1522    Clinical Impression Statement  Kailand has returned to work with success communicating and improved self confidence. He subjectively rates his speech at 50% compared to his speech prior to CVA. Continue skilled ST 1-2 more visits to maximize intelligiblity.    Speech Therapy Frequency  1x /week    Duration  --   8 weeks or 17 visits   Treatment/Interventions  Language facilitation;Environmental controls;Cueing hierarchy;Oral motor exercises;SLP instruction and feedback;Compensatory strategies;Functional tasks;Internal/external aids;Patient/family education    Potential to Achieve Goals  Good       Patient will benefit from skilled therapeutic intervention in order to improve the following deficits and impairments:   Dysarthria and anarthria    Problem List Patient Active Problem List   Diagnosis Date Noted  . Snoring 09/05/2019  . History of completed stroke 08/23/2019  . Class 2 obesity with body mass index (BMI) of 36.0 to 36.9 in adult 08/23/2019  . EtOH dependence ([place]) 08/23/2019  . Hypercalcemia 03/15/2014  . DDD (degenerative disc disease), lumbar 12/27/2012  . Routine health maintenance 02/12/2012  . CIGARETTE SMOKER 05/18/2010  . Dyslipidemia 10/12/2007  . Essential hypertension 10/12/2007  . HERNIATED CERVICAL DISC 10/12/2007  . Seizure disorder ([place]) 10/12/2007    Doriana Mazurkiewicz, Annye Rusk MS, CCC-SLP 12/11/2019, 3:25 PM  [facility] [address] [place], [place], [address] Phone: [phone]   Fax:  [phone]   Name: SHAUL TRAUTMAN MRN: [id number] Date of Birth: [birth year]

## 2019-12-11 NOTE — Patient Instructions (Signed)
   Great job being understood at work  Keep taking some breaks in a quiet place - the background music, people conversing with you can add to your fatigue as your brain is working harder to filter and process all of that at the same time you are trying to get your job done  Keep up the good work practicing - you are continuing to get better every week!!  Rest at night and on the weekends while you build up your stamina for a full work week

## 2019-12-16 ENCOUNTER — Ambulatory Visit: Payer: 59 | Admitting: Speech Pathology

## 2019-12-18 ENCOUNTER — Ambulatory Visit: Payer: 59 | Admitting: Speech Pathology

## 2019-12-18 ENCOUNTER — Encounter: Payer: Self-pay | Admitting: Speech Pathology

## 2019-12-18 ENCOUNTER — Other Ambulatory Visit: Payer: Self-pay

## 2019-12-18 DIAGNOSIS — R471 Dysarthria and anarthria: Secondary | ICD-10-CM

## 2019-12-18 NOTE — Patient Instructions (Signed)
  Stress and anxiety can increase your heart rate and blood pressure  Reducing stress and anxiety is good for your health right now  [place] job monitoring your blood pressure  Keep up practicing slow rate of speech and over-ennunciation  Make good health decisions - eating, drinking smoking - do the best you can

## 2019-12-18 NOTE — Therapy (Signed)
[facility] [address] [place], [place], [address] Phone: [phone]   Fax:  [phone]  Speech Language Pathology Treatment & Discharge Summary  Patient Details  Name: Anthony Harrington MRN: [id number] Date of Birth: 06/28/1966 Referring Provider (SLP): Dr. Pricilla Holm   Encounter Date: 12/18/2019  End of Session - 12/18/19 1458    Visit Number  9    Number of Visits  17    Date for SLP Re-Evaluation  01/01/20    SLP Start Time  11    SLP Stop Time   1303    SLP Time Calculation (min)  32 min    Activity Tolerance  Patient tolerated treatment well       Past Medical History:  Diagnosis Date  . CVA (cerebral vascular accident) ([place]) 08/2019  . DDD (degenerative disc disease)    lumbar and cervical  . Hyperlipidemia   . Hypertension   . Seizures ([place])    x 1 in his teen years    Past Surgical History:  Procedure Laterality Date  . FRACTURE SURGERY     left hip  . lumbar spine surgery  2003   microdiskectomy L5-S1 ([place])    There were no vitals filed for this visit.  Subjective Assessment - 12/18/19 1234    Subjective  "I'm doing OK"    Currently in Pain?  No/denies            ADULT SLP TREATMENT - 12/18/19 1235      General Information   Behavior/Cognition  Alert;Cooperative;Pleasant mood      Treatment Provided   Treatment provided  Cognitive-Linquistic      Cognitive-Linquistic Treatment   Treatment focused on  Dysarthria;Patient/family/caregiver education    Skilled Treatment  Boleslaus continues to work 40 hours a week and reports co-workers are not asking him to repeat himself. He has not participated in any phone calls. He states his s/o does cue him for slow rate. Structured speech tasks (HEP) with mod I for self correction and carryover of slow rate and over articulation. In conversation in mildly noisy environment, [place] is intelligible with compensatory strategies       Assessment / Recommendations / Plan   Plan  Discharge SLP treatment due to (comment)      Progression Toward Goals   Progression toward goals  Goals met, education completed, patient discharged from [facility] Education - 12/18/19 1456    Education Details  manage stress, continue HEP and compensations    Person(s) Educated  Patient    Methods  Explanation;Demonstration;Handout    Comprehension  Verbalized understanding;Returned demonstration        SPEECH THERAPY DISCHARGE SUMMARY  Visits from Start of Care: 9  Current functional level related to goals / functional outcomes: See goals below   Remaining deficits: Dysarthria   Education / Equipment: HEP for dysarthria, compensatory strategies for dysarthria; CVA ed Plan: Patient agrees to discharge.  Patient goals were met. Patient is being discharged due to meeting the stated rehab goals.  ?????           SLP Short Term Goals - 12/18/19 1458      SLP SHORT TERM GOAL #1   Title  Pt will complete HEP for dysarthria with rare min A over 2 sessions    Baseline  11/25/19; 12/02/19    Time  1    Period  Weeks    Status  Achieved  SLP SHORT TERM GOAL #2   Title  Pt will employ compensations for dysarthria to be 100% intellgible in sentence responses in structured speech tasks    Baseline  11/25/19; 12/02/19    Time  1    Period  Weeks    Status  Achieved      SLP SHORT TERM GOAL #3   Title  Pt will report, subjectively, 50% less requests for repetition by family over 2 sessions    Time  1    Period  Weeks    Status  Achieved      SLP SHORT TERM GOAL #5   Title  Pt will utilize compensations during word finding episodes with rare min A over 2 sessions    Time  3    Period  Weeks    Status  deferred - resolved       SLP Long Term Goals - 12/18/19 1458      SLP LONG TERM GOAL #1   Title  Pt will complete HEP for dysarthria with mod I over 2 sessions    Baseline  12/02/10    Time  3    Period  Weeks     Status  Achieved      SLP LONG TERM GOAL #2   Title  Pt will be 90% intelligible over 20 minute mildly complex conversation with rare min A over 2 sessions    Baseline  12/02/19, 12/11/19    Time  3    Period  Weeks    Status  Achieved      SLP LONG TERM GOAL #3   Title  Pt will be 90% intelligible during 20 minute conversation in a noisy environment with rare min A over 2 sessions    Time  3    Period  Weeks    Status  Achieved       Plan - 12/18/19 1456    Clinical Impression Statement  Maccoy returned to work 40 hours a week with modified responsibilites. He reports his co-workers understand him and he is not asked to repeat himself at work. When Parish uses slow rate and over articulation, speech is intellgible. Goals met, education complete. D/c ST at this time.Jarrad is in agreement    Speech Therapy Frequency  1x /week    Duration  --   8 weeks or 17 visits   Treatment/Interventions  Language facilitation;Environmental controls;Cueing hierarchy;Oral motor exercises;SLP instruction and feedback;Compensatory strategies;Functional tasks;Internal/external aids;Patient/family education    Potential to Achieve Goals  Good       Patient will benefit from skilled therapeutic intervention in order to improve the following deficits and impairments:   Dysarthria and anarthria    Problem List Patient Active Problem List   Diagnosis Date Noted  . Snoring 09/05/2019  . History of completed stroke 08/23/2019  . Class 2 obesity with body mass index (BMI) of 36.0 to 36.9 in adult 08/23/2019  . EtOH dependence ([place]) 08/23/2019  . Hypercalcemia 03/15/2014  . DDD (degenerative disc disease), lumbar 12/27/2012  . Routine health maintenance 02/12/2012  . CIGARETTE SMOKER 05/18/2010  . Dyslipidemia 10/12/2007  . Essential hypertension 10/12/2007  . HERNIATED CERVICAL DISC 10/12/2007  . Seizure disorder ([place]) 10/12/2007    Seham Gardenhire, Annye Rusk MS, CCC-SLP 12/18/2019, 3:00 PM  [facility] [address] [place], [place], [address] Phone: [phone]   Fax:  [phone]   Name: NEVAAN BUNTON MRN: [id number] Date of Birth: Feb 02, 1966

## 2019-12-31 ENCOUNTER — Ambulatory Visit: Payer: 59 | Admitting: Internal Medicine

## 2020-01-09 ENCOUNTER — Encounter: Payer: Self-pay | Admitting: Internal Medicine

## 2020-01-09 ENCOUNTER — Ambulatory Visit (INDEPENDENT_AMBULATORY_CARE_PROVIDER_SITE_OTHER): Payer: Self-pay | Admitting: Internal Medicine

## 2020-01-09 ENCOUNTER — Other Ambulatory Visit: Payer: Self-pay

## 2020-01-09 VITALS — BP 148/92 | HR 89 | Temp 98.2°F | Ht 72.0 in | Wt 256.0 lb

## 2020-01-09 DIAGNOSIS — I10 Essential (primary) hypertension: Secondary | ICD-10-CM

## 2020-01-09 DIAGNOSIS — Z8673 Personal history of transient ischemic attack (TIA), and cerebral infarction without residual deficits: Secondary | ICD-10-CM

## 2020-01-09 NOTE — Patient Instructions (Signed)
We will get you back in with the speech specialist so let us know how this is going.

## 2020-01-10 NOTE — Assessment & Plan Note (Signed)
BP elevated mildly today and he is asked to monitor at home and let us know. He is not taking amlodipine which is on his current medication list. I have removed but if BP remains elevated from goal 130/80 will resume.

## 2020-01-10 NOTE — Assessment & Plan Note (Signed)
Refer back to neuro rehab and SLP in particular. He wishes to work with same SLP therapist if possible as he felt she was effective.

## 2020-01-10 NOTE — Progress Notes (Signed)
   Subjective:   Patient ID: Anthony Harrington, male    DOB: [birth year], 54 y.o.   MRN: [id number]  HPI The patient is a 54 YO man coming in for ongoing effects of stroke. Is having some speech issues and did stop going to SLP on his own. He would like to resume. He is back to work since beginning of March. He feels things are going well. His work has asked him to look into long term disability but he is not sure he needs this and is unaware of any issues with his work or at work. Is taking medications but not all those listed on his medication list. Denies headaches or chest pains. Denies new stroke symptoms such as speech changes, numbness, weakness, falls.   Review of Systems  Constitutional: Negative.   HENT: Negative.   Eyes: Negative.   Respiratory: Negative for cough, chest tightness and shortness of breath.   Cardiovascular: Negative for chest pain, palpitations and leg swelling.  Gastrointestinal: Negative for abdominal distention, abdominal pain, constipation, diarrhea, nausea and vomiting.  Musculoskeletal: Negative.   Skin: Negative.   Neurological: Positive for speech difficulty.  Psychiatric/Behavioral: Negative.     Objective:  Physical Exam Constitutional:      Appearance: He is well-developed.  HENT:     Head: Normocephalic and atraumatic.  Cardiovascular:     Rate and Rhythm: Normal rate and regular rhythm.  Pulmonary:     Effort: Pulmonary effort is normal. No respiratory distress.     Breath sounds: Normal breath sounds. No wheezing or rales.  Abdominal:     General: Bowel sounds are normal. There is no distension.     Palpations: Abdomen is soft.     Tenderness: There is no abdominal tenderness. There is no rebound.  Musculoskeletal:     Cervical back: Normal range of motion.  Skin:    General: Skin is warm and dry.  Neurological:     Mental Status: He is alert and oriented to person, place, and time. Mental status is at baseline.     Coordination: Coordination  normal.     Comments: Some enunciation issues but understandable and speech is fluent without hesitations or gaps or lost words.      Vitals:   01/09/20 1538 01/09/20 1607  BP: (!) 158/102 (!) 148/92  Pulse: 89   Temp: 98.2 F (36.8 C)   TempSrc: Oral   SpO2: 98%   Weight: 256 lb (116.1 kg)   Height: 6' (1.829 m)     This visit occurred during the SARS-CoV-2 public health emergency.  Safety protocols were in place, including screening questions prior to the visit, additional usage of staff PPE, and extensive cleaning of exam room while observing appropriate contact time as indicated for disinfecting solutions.   Assessment & Plan:

## 2020-07-13 ENCOUNTER — Ambulatory Visit: Payer: Self-pay | Admitting: Internal Medicine

## 2020-08-14 ENCOUNTER — Encounter: Payer: Self-pay | Admitting: Internal Medicine

## 2020-08-14 ENCOUNTER — Ambulatory Visit (INDEPENDENT_AMBULATORY_CARE_PROVIDER_SITE_OTHER): Payer: 59 | Admitting: Internal Medicine

## 2020-08-14 ENCOUNTER — Other Ambulatory Visit: Payer: Self-pay

## 2020-08-14 VITALS — BP 158/98 | HR 78 | Temp 98.0°F | Ht 72.0 in | Wt 260.0 lb

## 2020-08-14 DIAGNOSIS — I10 Essential (primary) hypertension: Secondary | ICD-10-CM | POA: Diagnosis not present

## 2020-08-14 DIAGNOSIS — Z0000 Encounter for general adult medical examination without abnormal findings: Secondary | ICD-10-CM

## 2020-08-14 DIAGNOSIS — F17200 Nicotine dependence, unspecified, uncomplicated: Secondary | ICD-10-CM

## 2020-08-14 DIAGNOSIS — Z8673 Personal history of transient ischemic attack (TIA), and cerebral infarction without residual deficits: Secondary | ICD-10-CM | POA: Diagnosis not present

## 2020-08-14 DIAGNOSIS — E785 Hyperlipidemia, unspecified: Secondary | ICD-10-CM

## 2020-08-14 LAB — LIPID PANEL
Cholesterol: 183 mg/dL (ref 0–200)
HDL: 50.6 mg/dL (ref >39.00)
LDL Cholesterol: 96 mg/dL (ref 0–99)
NonHDL: 132.02
Total CHOL/HDL Ratio: 4
Triglycerides: 178 mg/dL — ABNORMAL HIGH (ref 0.0–149.0)
VLDL: 35.6 mg/dL (ref 0.0–40.0)

## 2020-08-14 LAB — COMPREHENSIVE METABOLIC PANEL
ALT: 20 U/L (ref 0–53)
AST: 17 U/L (ref 0–37)
Albumin: 4.8 g/dL (ref 3.5–5.2)
Alkaline Phosphatase: 81 U/L (ref 39–117)
BUN: 19 mg/dL (ref 6–23)
CO2: 29 mEq/L (ref 19–32)
Calcium: 9.9 mg/dL (ref 8.4–10.5)
Chloride: 104 mEq/L (ref 96–112)
Creatinine, Ser: 0.82 mg/dL (ref 0.40–1.50)
GFR: 100.54 mL/min (ref >60.00)
Glucose, Bld: 107 mg/dL — ABNORMAL HIGH (ref 70–99)
Potassium: 3.7 mEq/L (ref 3.5–5.1)
Sodium: 140 mEq/L (ref 135–145)
Total Bilirubin: 0.5 mg/dL (ref 0.2–1.2)
Total Protein: 7.3 g/dL (ref 6.0–8.3)

## 2020-08-14 LAB — CBC
HCT: 42.5 % (ref 39.0–52.0)
Hemoglobin: 14.6 g/dL (ref 13.0–17.0)
MCHC: 34.3 g/dL (ref 30.0–36.0)
MCV: 96 fl (ref 78.0–100.0)
Platelets: 204 10*3/uL (ref 150.0–400.0)
RBC: 4.43 Mil/uL (ref 4.22–5.81)
RDW: 13.1 % (ref 11.5–15.5)
WBC: 7.1 10*3/uL (ref 4.0–10.5)

## 2020-08-14 MED ORDER — ESCITALOPRAM OXALATE 10 MG PO TABS: 10.0000 mg | ORAL_TABLET | Freq: Every day | ORAL | 3 refills | Status: DC

## 2020-08-14 NOTE — Assessment & Plan Note (Addendum)
BP high today but normal at home per patient. He is anxious about healthcare in general. Continue atenolol and losartan/hctz. Checking CMP and adjust as needed.

## 2020-08-14 NOTE — Patient Instructions (Addendum)
We have sent in lexapro to take daily for the anxiety. Give this about a month and then let us know how it is going.   If blood pressure at home is >150/90 consistently call us.   Health Maintenance, Male Adopting a healthy lifestyle and getting preventive care are important in promoting health and wellness. Ask your health care provider about:  The right schedule for you to have regular tests and exams.  Things you can do on your own to prevent diseases and keep yourself healthy. What should I know about diet, weight, and exercise? Eat a healthy diet   Eat a diet that includes plenty of vegetables, fruits, low-fat dairy products, and lean protein.  Do not eat a lot of foods that are high in solid fats, added sugars, or sodium. Maintain a healthy weight Body mass index (BMI) is a measurement that can be used to identify possible weight problems. It estimates body fat based on height and weight. Your health care provider can help determine your BMI and help you achieve or maintain a healthy weight. Get regular exercise Get regular exercise. This is one of the most important things you can do for your health. Most adults should:  Exercise for at least 150 minutes each week. The exercise should increase your heart rate and make you sweat (moderate-intensity exercise).  Do strengthening exercises at least twice a week. This is in addition to the moderate-intensity exercise.  Spend less time sitting. Even light physical activity can be beneficial. Watch cholesterol and blood lipids Have your blood tested for lipids and cholesterol at 53 years of age, then have this test every 5 years. You may need to have your cholesterol levels checked more often if:  Your lipid or cholesterol levels are high.  You are older than 53 years of age.  You are at high risk for heart disease. What should I know about cancer screening? Many types of cancers can be detected early and may often be prevented.  Depending on your health history and family history, you may need to have cancer screening at various ages. This may include screening for:  Colorectal cancer.  Prostate cancer.  Skin cancer.  Lung cancer. What should I know about heart disease, diabetes, and high blood pressure? Blood pressure and heart disease  High blood pressure causes heart disease and increases the risk of stroke. This is more likely to develop in people who have high blood pressure readings, are of African descent, or are overweight.  Talk with your health care provider about your target blood pressure readings.  Have your blood pressure checked: ? Every 3-5 years if you are 34-69 years of age. ? Every year if you are 72 years old or older.  If you are between the ages of 39 and 39 and are a current or former smoker, ask your health care provider if you should have a one-time screening for abdominal aortic aneurysm (AAA). Diabetes Have regular diabetes screenings. This checks your fasting blood sugar level. Have the screening done:  Once every three years after age 30 if you are at a normal weight and have a low risk for diabetes.  More often and at a younger age if you are overweight or have a high risk for diabetes. What should I know about preventing infection? Hepatitis B If you have a higher risk for hepatitis B, you should be screened for this virus. Talk with your health care provider to find out if you are at  risk for hepatitis B infection. Hepatitis C Blood testing is recommended for:  Everyone born from [birth year] through 1964.  Anyone with known risk factors for hepatitis C. Sexually transmitted infections (STIs)  You should be screened each year for STIs, including gonorrhea and chlamydia, if: ? You are sexually active and are younger than 53 years of age. ? You are older than 53 years of age and your health care provider tells you that you are at risk for this type of infection. ? Your sexual  activity has changed since you were last screened, and you are at increased risk for chlamydia or gonorrhea. Ask your health care provider if you are at risk.  Ask your health care provider about whether you are at high risk for HIV. Your health care provider may recommend a prescription medicine to help prevent HIV infection. If you choose to take medicine to prevent HIV, you should first get tested for HIV. You should then be tested every 3 months for as long as you are taking the medicine. Follow these instructions at home: Lifestyle  Do not use any products that contain nicotine or tobacco, such as cigarettes, e-cigarettes, and chewing tobacco. If you need help quitting, ask your health care provider.  Do not use street drugs.  Do not share needles.  Ask your health care provider for help if you need support or information about quitting drugs. Alcohol use  Do not drink alcohol if your health care provider tells you not to drink.  If you drink alcohol: ? Limit how much you have to 0-2 drinks a day. ? Be aware of how much alcohol is in your drink. In the [place] one drink equals one 12 oz bottle of beer (355 mL), one 5 oz glass of wine (148 mL), or one 1 oz glass of hard liquor (44 mL). General instructions  Schedule regular health, dental, and eye exams.  Stay current with your vaccines.  Tell your health care provider if: ? You often feel depressed. ? You have ever been abused or do not feel safe at home. Summary  Adopting a healthy lifestyle and getting preventive care are important in promoting health and wellness.  Follow your health care provider's instructions about healthy diet, exercising, and getting tested or screened for diseases.  Follow your health care provider's instructions on monitoring your cholesterol and blood pressure. This information is not intended to replace advice given to you by your health care provider. Make sure you discuss any questions you have  with your health care provider. Document Revised: 09/12/2018 Document Reviewed: 09/12/2018 Elsevier Patient Education  2020 [employer].

## 2020-08-14 NOTE — Assessment & Plan Note (Signed)
Flu shot declines. Covid-19 got J and J single dose. Shingrix declines. Tetanus declines. Colonoscopy declines. Counseled about sun safety and mole surveillance. Counseled about the dangers of distracted driving. Given 10 year screening recommendations.

## 2020-08-14 NOTE — Assessment & Plan Note (Addendum)
Needs HgA1c today. On ASA 325 and statin. No recurrent symptoms.

## 2020-08-14 NOTE — Progress Notes (Signed)
   Subjective:   Patient ID: Anthony Harrington, male    DOB: 10-20-1965, 54 y.o.   MRN: [id number]  HPI The patient is a 54 YO man coming in for physical.   PMH, FMH, social history reviewed and updated  Review of Systems  Constitutional: Negative.   HENT: Negative.   Eyes: Negative.   Respiratory: Negative for cough, chest tightness and shortness of breath.   Cardiovascular: Negative for chest pain, palpitations and leg swelling.  Gastrointestinal: Negative for abdominal distention, abdominal pain, constipation, diarrhea, nausea and vomiting.  Musculoskeletal: Negative.   Skin: Negative.   Neurological: Negative.   Psychiatric/Behavioral: The patient is nervous/anxious.     Objective:  Physical Exam Constitutional:      Appearance: He is well-developed.  HENT:     Head: Normocephalic and atraumatic.  Cardiovascular:     Rate and Rhythm: Normal rate and regular rhythm.  Pulmonary:     Effort: Pulmonary effort is normal. No respiratory distress.     Breath sounds: Normal breath sounds. No wheezing or rales.  Abdominal:     General: Bowel sounds are normal. There is no distension.     Palpations: Abdomen is soft.     Tenderness: There is no abdominal tenderness. There is no rebound.  Musculoskeletal:     Cervical back: Normal range of motion.  Skin:    General: Skin is warm and dry.  Neurological:     Mental Status: He is alert and oriented to person, place, and time.     Coordination: Coordination normal.     Vitals:   08/14/20 1340 08/14/20 1421  BP: (!) 170/100 (!) 158/98  Pulse: 78   Temp: 98 F (36.7 C)   TempSrc: Oral   SpO2: 98%   Weight: 260 lb (117.9 kg)   Height: 6' (1.829 m)     This visit occurred during the SARS-CoV-2 public health emergency.  Safety protocols were in place, including screening questions prior to the visit, additional usage of staff PPE, and extensive cleaning of exam room while observing appropriate contact time as indicated for  disinfecting solutions.   Assessment & Plan:

## 2020-08-14 NOTE — Assessment & Plan Note (Signed)
Down to 1/2 PPD and encouraged to quit altogether given past stroke. He will think about this.

## 2020-08-14 NOTE — Assessment & Plan Note (Signed)
Checking lipid panel and adjust as needed for goal LDL<70 given past stroke. Taking lipitor 40 mg daily.

## 2020-08-17 LAB — HEMOGLOBIN A1C: Hgb A1c MFr Bld: 5.9 % (ref 4.6–6.5)

## 2020-09-04 ENCOUNTER — Telehealth: Payer: Self-pay | Admitting: Internal Medicine

## 2020-09-04 NOTE — Telephone Encounter (Signed)
Patient called and was wanting to know the results of his recent lab work. He can be reached at [phone]

## 2020-09-04 NOTE — Telephone Encounter (Signed)
Called pt- left detailed message informing pt of below, as I am seeing patients in the lab today and I can't be on the phone.  See 08/14/20 lab results  The labs are normal except mildly high cholesterol. If you are willing we should increase the dose of the atorvastatin (lipitor).

## 2020-10-15 ENCOUNTER — Encounter: Payer: Self-pay | Admitting: Internal Medicine

## 2020-10-16 ENCOUNTER — Other Ambulatory Visit: Payer: Self-pay | Admitting: *Deleted

## 2020-10-16 MED ORDER — LOSARTAN POTASSIUM-HCTZ 100-25 MG PO TABS: 1.0000 | ORAL_TABLET | Freq: Every day | ORAL | 3 refills | Status: DC

## 2020-10-16 MED ORDER — ATENOLOL 50 MG PO TABS: 50.0000 mg | ORAL_TABLET | Freq: Every day | ORAL | 3 refills | Status: DC

## 2020-12-18 IMAGING — MR MR HEAD W/O CM
13 of 15 series · 34 of 48 positions shown · non-contrast
Comparison: Head CT 08/31/2019

CLINICAL DATA: Stroke, follow-up. Additional history provided:
History of seizures, presenting with slurred speech and facial
droop.

EXAM:
MRI HEAD WITHOUT CONTRAST
MRA HEAD WITHOUT CONTRAST
TECHNIQUE: Multiplanar, multiecho pulse sequences of the brain and surrounding
structures were obtained without intravenous contrast. Angiographic
images of the head were obtained using MRA technique without
contrast.

[Series 5: DWI · axial · 3.0mm · 0.88mm/px · z∈[-76,+82]mm · 7 of 108 slices shown (1 of 4)]
[im 1/108]
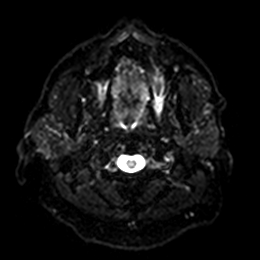
[im 18/108]
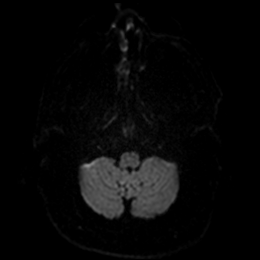
[im 36/108]
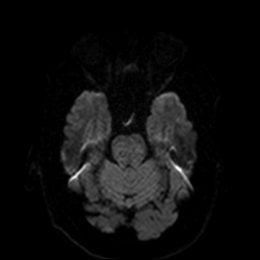
[im 54/108]
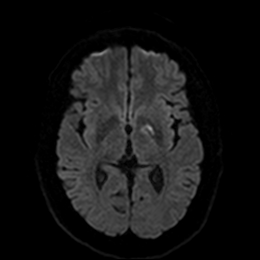
[im 72/108]
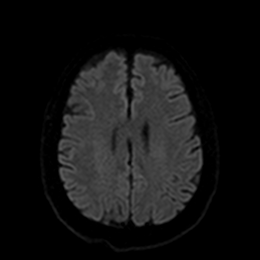
[im 90/108]
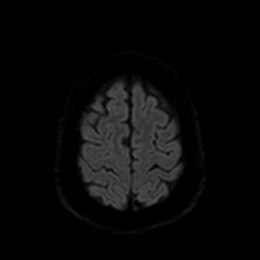
[im 108/108]
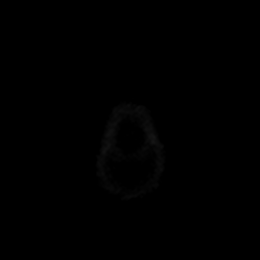

[Series 6: DWI · axial · 3.0mm · 0.88mm/px · z∈[-76,+82]mm · 3 of 53 slices shown (2 of 4)]
[im 1/53]
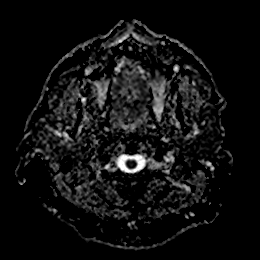
[im 27/53]
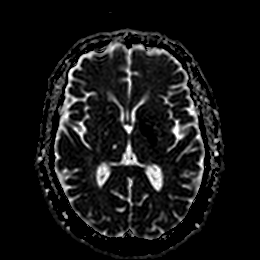
[im 53/53]
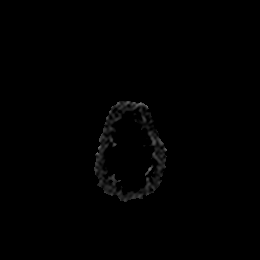

[Series 11: DWI · coronal · 4.0mm · 0.88mm/px · 4 of 72 slices shown (3 of 4)]
[im 1/72]
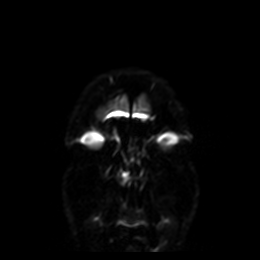
[im 24/72]
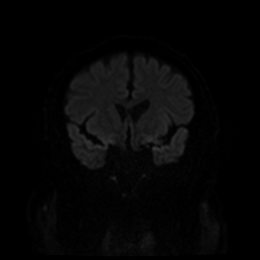
[im 48/72]
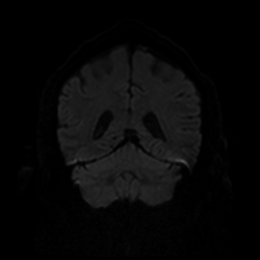
[im 72/72]
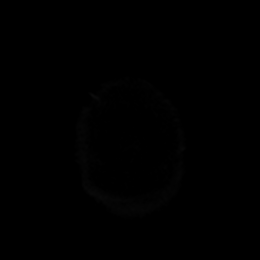

[Series 12: DWI · coronal · 4.0mm · 0.88mm/px · 2 of 36 slices shown (4 of 4)]
[im 1/36]
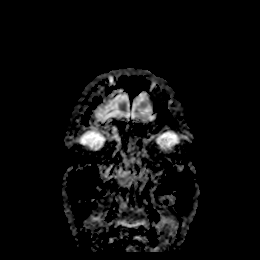
[im 36/36]
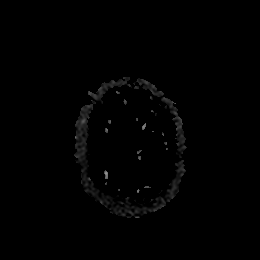

[Series 13: FLAIR · axial · 5.0mm · 0.45mm/px · 1 of 26 slices shown (1 of 2)]
[im 1/26]
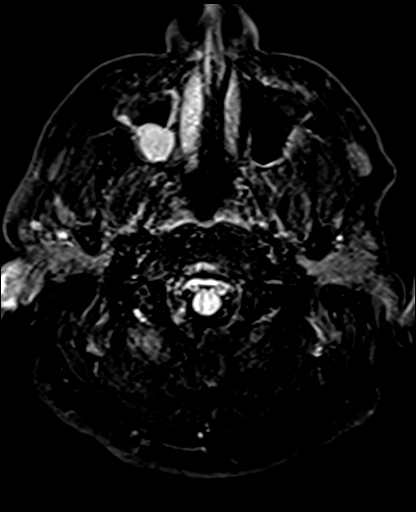

[Series 14: mag_images · axial · 3.0mm · 0.90mm/px · z∈[-81,+96]mm · 3 of 60 slices shown]
[im 1/60]
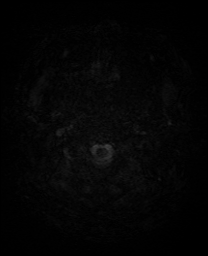
[im 30/60]
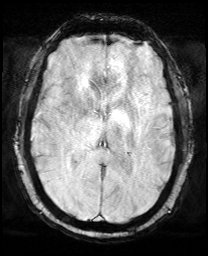
[im 60/60]
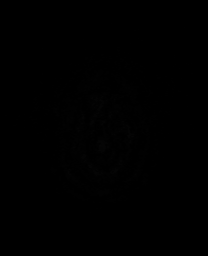

[Series 15: pha_images · axial · 3.0mm · 0.90mm/px · z∈[-78,+93]mm · 3 of 58 slices shown]
[im 1/58]
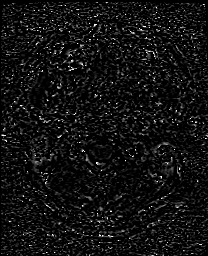
[im 29/58]
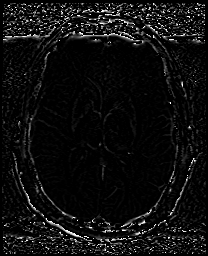
[im 58/58]
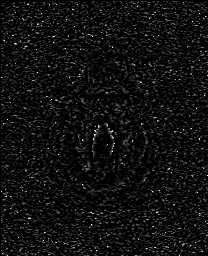

[Series 16: swi_images · axial · 3.0mm · 0.90mm/px · z∈[-81,+96]mm · 3 of 60 slices shown]
[im 1/60]
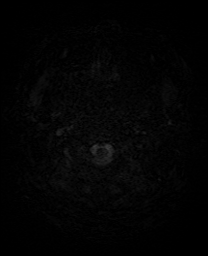
[im 30/60]
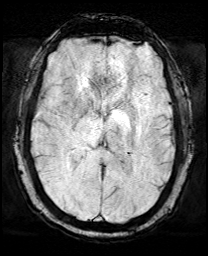
[im 60/60]
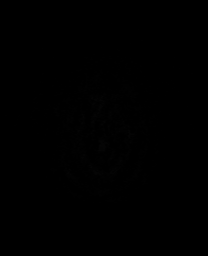

[Series 17: mip_images(sw) · axial · 24.0mm · 0.90mm/px · z∈[-70,+85]mm · 3 of 53 slices shown]
[im 1/53]
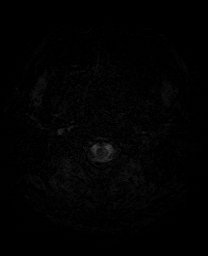
[im 27/53]
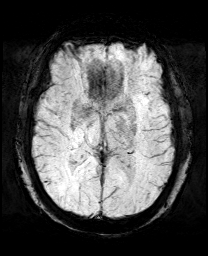
[im 53/53]
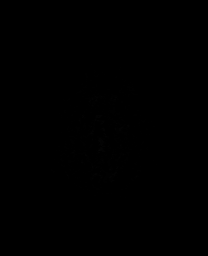

[Series 18: T1 · sagittal · 5.0mm · 0.78mm/px · 1 of 25 slices shown]
[im 1/25]
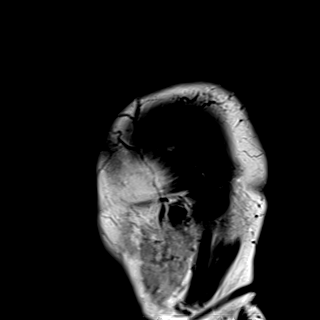

[Series 19: T2 · axial · 5.0mm · 0.72mm/px · 1 of 26 slices shown]
[im 1/26]
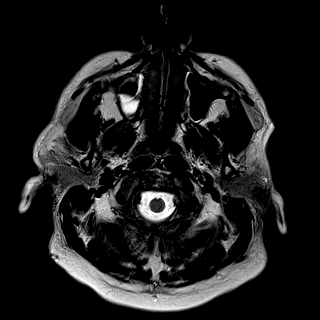

[Series 21: FLAIR · axial · 5.0mm · 0.90mm/px · 1 of 26 slices shown (2 of 2)]
[im 1/26]
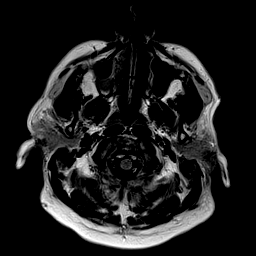

[Series 22: T2 post-contrast · coronal · 5.0mm · 0.72mm/px · 2 of 30 slices shown]
[im 1/30]
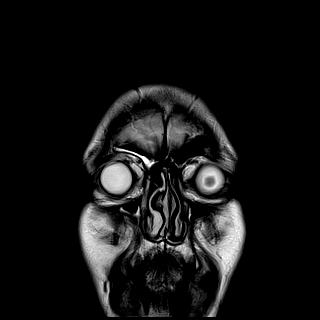
[im 30/30]
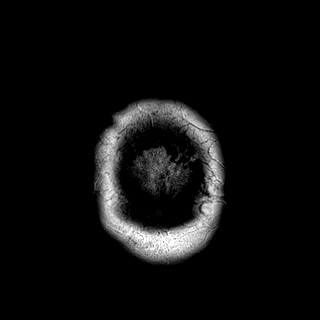

[34 of 48 positions shown; findings below may reference images not displayed]

FINDINGS: MRI HEAD FINDINGS

Brain:

There is an acute infarct centered within the left thalamocapsular
junction, also involving portions of the left internal capsule genu
and left lentiform nucleus. The infarct measures 2.6 x 0.9 x 1.8 cm
(AP x TV x CC). Corresponding T2/FLAIR hyperintensity at this site.

No evidence of intracranial mass. No midline shift or extra-axial
fluid collection. No chronic intracranial blood products.

Small chronic lacunar infarcts within the right thalamus, right pons
and left cerebellum. There is a background of mild chronic small
vessel ischemic disease. Cerebral volume is normal for age.

Vascular: Reported separately

Skull and upper cervical spine: No focal marrow lesion.

Sinuses/Orbits: Visualized orbits demonstrate no acute abnormality.
Mild scattered paranasal sinus mucosal thickening. Moderate-sized
right maxillary sinus mucous retention cyst. No significant mastoid
effusion.

MRA HEAD FINDINGS

The intracranial internal carotid arteries are patent with only mild
atherosclerotic irregularity.

The M1 right middle cerebral artery is patent without significant
stenosis. The M2 right MCA branches are patent without proximal
branch occlusion. The right anterior cerebral artery is patent
without significant proximal stenosis.

The M1 left middle cerebral artery is patent without significant
stenosis. The M2 left MCA branches are patent without proximal
branch occlusion. The left anterior cerebral artery is patent
without significant proximal stenosis

No intracranial aneurysm is identified.

Dominant intracranial right vertebral artery without significant
stenosis. Non dominant intracranial left vertebral artery with a
mild focal stenosis beyond the origin of the left PICA. The basilar
artery is patent without significant stenosis. The bilateral
posterior cerebral arteries are patent without significant proximal
stenosis.

Posterior communicating arteries are poorly delineated and may be
hypoplastic or absent bilaterally.
IMPRESSION: MRI brain:

1. 2.6 x 0.9 x 1.8 cm acute infarct centered within the left
thalamocapsular junction, also involving portions of the left
internal capsule genu and left lentiform nucleus.
2. Background of mild chronic small vessel ischemic disease with
several small chronic lacunar infarcts.

MRA head:

1. No intracranial large vessel occlusion or proximal high-grade
arterial stenosis.
2. Mild atherosclerotic irregularity of the intracranial internal
carotid arteries.
3. Mild focal stenosis within the non dominant left vertebral artery
beyond the left PICA origin.

## 2020-12-22 ENCOUNTER — Other Ambulatory Visit: Payer: Self-pay | Admitting: Neurology

## 2020-12-24 ENCOUNTER — Other Ambulatory Visit: Payer: Self-pay | Admitting: Neurology

## 2021-01-20 ENCOUNTER — Other Ambulatory Visit: Payer: Self-pay | Admitting: Neurology

## 2021-02-09 IMAGING — DX DG CHEST 1V PORT
1 series · 1 of 1 positions shown · non-contrast
Comparison: February 09, 2012

CLINICAL DATA: Hypertension and tachycardia

EXAM:
PORTABLE CHEST 1 VIEW

[chest ap]
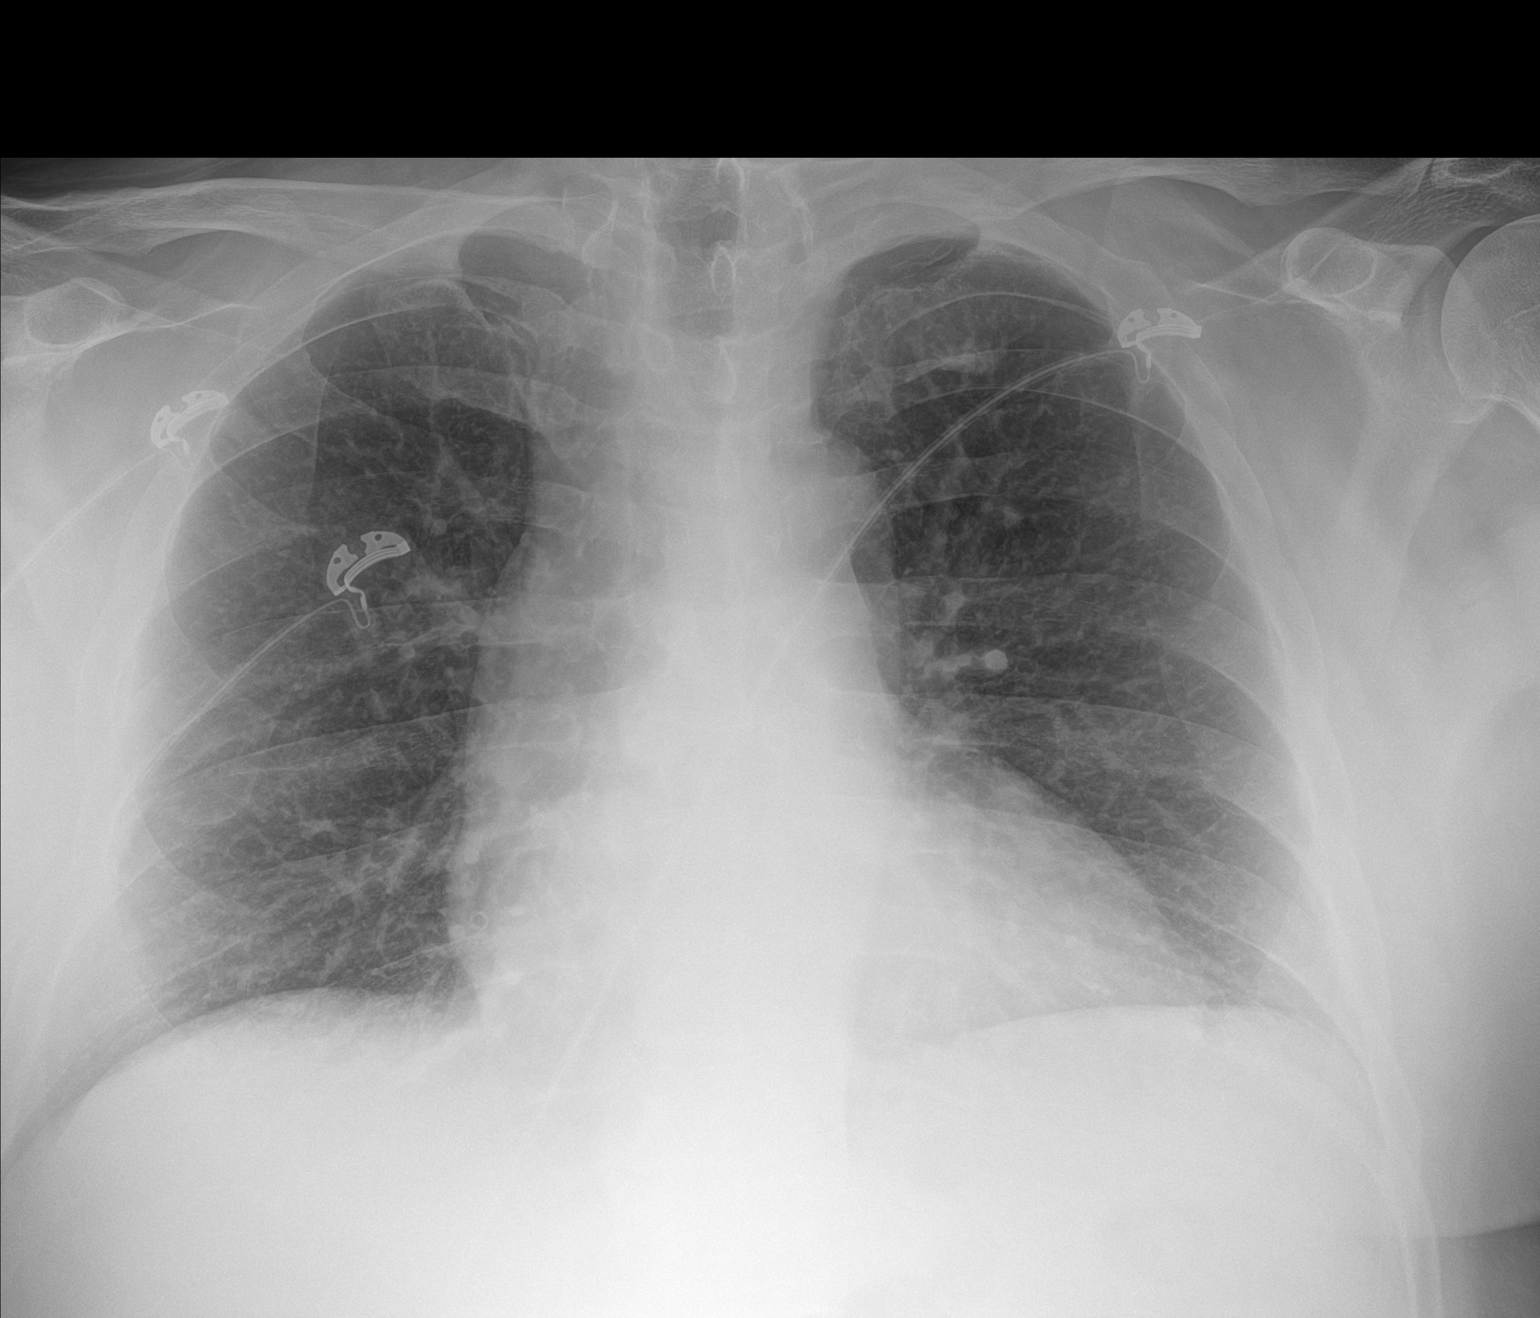

[1 of 1 positions shown; findings below may reference images not displayed]

FINDINGS: Lungs are clear. Heart is mildly enlarged with pulmonary vascularity
normal. No adenopathy. No bone lesions. No pneumothorax.
IMPRESSION: Cardiomegaly.  Lungs clear.  No adenopathy.

## 2021-02-11 ENCOUNTER — Ambulatory Visit: Payer: 59 | Admitting: Internal Medicine

## 2021-02-12 ENCOUNTER — Encounter: Payer: Self-pay | Admitting: Internal Medicine

## 2021-02-12 ENCOUNTER — Other Ambulatory Visit: Payer: Self-pay

## 2021-02-12 ENCOUNTER — Ambulatory Visit: Payer: 59 | Admitting: Internal Medicine

## 2021-02-12 DIAGNOSIS — F17200 Nicotine dependence, unspecified, uncomplicated: Secondary | ICD-10-CM

## 2021-02-12 DIAGNOSIS — I10 Essential (primary) hypertension: Secondary | ICD-10-CM

## 2021-02-12 DIAGNOSIS — E785 Hyperlipidemia, unspecified: Secondary | ICD-10-CM

## 2021-02-12 MED ORDER — ATORVASTATIN CALCIUM 40 MG PO TABS: 40.0000 mg | ORAL_TABLET | Freq: Every day | ORAL | 3 refills | Status: DC

## 2021-02-12 NOTE — Assessment & Plan Note (Signed)
BP mildly elevated but he gets anxious in healthcare situations and normal at home. Taking atenolol and losartan/hcz. Past CMP without indication for change.

## 2021-02-12 NOTE — Assessment & Plan Note (Signed)
Refill lipitor 40 mg daily. Prior lipid panel at goal on meds. He will resume.

## 2021-02-12 NOTE — Assessment & Plan Note (Signed)
No desire to quit currently.  

## 2021-02-12 NOTE — Progress Notes (Signed)
   Subjective:   Patient ID: Anthony Harrington, male    DOB: 1966/06/03, 55 y.o.   MRN: [id number]  HPI The patient is a 55 YO man coming in for follow up cholesterol (taking lipitor and out for several months, previously prescribed by neurology but he does not have ongoing follow up with them), and HTN (taking his atenolol and losartan/hctz, denies chest pains or headaches), and tobacco abuse (denies wanting to quit currently).   Review of Systems  Constitutional: Negative.   HENT: Negative.   Eyes: Negative.   Respiratory: Negative for cough, chest tightness and shortness of breath.   Cardiovascular: Negative for chest pain, palpitations and leg swelling.  Gastrointestinal: Negative for abdominal distention, abdominal pain, constipation, diarrhea, nausea and vomiting.  Musculoskeletal: Negative.   Skin: Negative.   Neurological: Negative.   Psychiatric/Behavioral: Negative.     Objective:  Physical Exam Constitutional:      Appearance: He is well-developed.  HENT:     Head: Normocephalic and atraumatic.  Cardiovascular:     Rate and Rhythm: Normal rate and regular rhythm.  Pulmonary:     Effort: Pulmonary effort is normal. No respiratory distress.     Breath sounds: Normal breath sounds. No wheezing or rales.  Abdominal:     General: Bowel sounds are normal. There is no distension.     Palpations: Abdomen is soft.     Tenderness: There is no abdominal tenderness. There is no rebound.  Musculoskeletal:     Cervical back: Normal range of motion.  Skin:    General: Skin is warm and dry.  Neurological:     Mental Status: He is alert and oriented to person, place, and time.     Coordination: Coordination normal.     Vitals:   02/12/21 1550  BP: (!) 180/120  Pulse: 73  Temp: 98.4 F (36.9 C)  TempSrc: Oral  SpO2: 97%  Weight: 267 lb (121.1 kg)  Height: 6' (1.829 m)    This visit occurred during the SARS-CoV-2 public health emergency.  Safety protocols were in place,  including screening questions prior to the visit, additional usage of staff PPE, and extensive cleaning of exam room while observing appropriate contact time as indicated for disinfecting solutions.   Assessment & Plan:

## 2021-02-12 NOTE — Patient Instructions (Signed)
We have sent in the refill of the cholesterol medication.

## 2021-08-16 ENCOUNTER — Encounter: Payer: Self-pay | Admitting: Internal Medicine

## 2021-08-16 ENCOUNTER — Other Ambulatory Visit: Payer: Self-pay

## 2021-08-16 ENCOUNTER — Ambulatory Visit (INDEPENDENT_AMBULATORY_CARE_PROVIDER_SITE_OTHER): Payer: 59 | Admitting: Internal Medicine

## 2021-08-16 VITALS — BP 124/70 | HR 68 | Resp 18 | Ht 72.0 in | Wt 273.2 lb

## 2021-08-16 DIAGNOSIS — Z0000 Encounter for general adult medical examination without abnormal findings: Secondary | ICD-10-CM | POA: Diagnosis not present

## 2021-08-16 DIAGNOSIS — F17200 Nicotine dependence, unspecified, uncomplicated: Secondary | ICD-10-CM

## 2021-08-16 DIAGNOSIS — F1020 Alcohol dependence, uncomplicated: Secondary | ICD-10-CM

## 2021-08-16 DIAGNOSIS — Z8673 Personal history of transient ischemic attack (TIA), and cerebral infarction without residual deficits: Secondary | ICD-10-CM

## 2021-08-16 DIAGNOSIS — I10 Essential (primary) hypertension: Secondary | ICD-10-CM

## 2021-08-16 DIAGNOSIS — E785 Hyperlipidemia, unspecified: Secondary | ICD-10-CM

## 2021-08-16 NOTE — Progress Notes (Signed)
   Subjective:   Patient ID: Anthony Harrington, male    DOB: [birth year], 55 y.o.   MRN: [id number]  HPI The patient is a 55 YO man coming in for physical.   PMH, FMH, social history reviewed and updated  Review of Systems  Constitutional: Negative.   HENT: Negative.    Eyes: Negative.   Respiratory:  Negative for cough, chest tightness and shortness of breath.   Cardiovascular:  Negative for chest pain, palpitations and leg swelling.  Gastrointestinal:  Negative for abdominal distention, abdominal pain, constipation, diarrhea, nausea and vomiting.  Musculoskeletal: Negative.   Skin: Negative.   Neurological: Negative.   Psychiatric/Behavioral: Negative.     Objective:  Physical Exam Constitutional:      Appearance: He is well-developed.  HENT:     Head: Normocephalic and atraumatic.  Cardiovascular:     Rate and Rhythm: Normal rate and regular rhythm.  Pulmonary:     Effort: Pulmonary effort is normal. No respiratory distress.     Breath sounds: Normal breath sounds. No wheezing or rales.  Abdominal:     General: Bowel sounds are normal. There is no distension.     Palpations: Abdomen is soft.     Tenderness: There is no abdominal tenderness. There is no rebound.  Musculoskeletal:     Cervical back: Normal range of motion.  Skin:    General: Skin is warm and dry.  Neurological:     Mental Status: He is alert and oriented to person, place, and time.     Coordination: Coordination normal.    Vitals:   08/16/21 1559  BP: 124/70  Pulse: 68  Resp: 18  SpO2: 95%  Weight: 273 lb 3.2 oz (123.9 kg)  Height: 6' (1.829 m)    This visit occurred during the SARS-CoV-2 public health emergency.  Safety protocols were in place, including screening questions prior to the visit, additional usage of staff PPE, and extensive cleaning of exam room while observing appropriate contact time as indicated for disinfecting solutions.   Assessment & Plan:

## 2021-08-17 LAB — LIPID PANEL
Cholesterol: 201 mg/dL — ABNORMAL HIGH (ref 0–200)
HDL: 62 mg/dL (ref >39.00)
LDL Cholesterol: 102 mg/dL — ABNORMAL HIGH (ref 0–99)
NonHDL: 138.78
Total CHOL/HDL Ratio: 3
Triglycerides: 184 mg/dL — ABNORMAL HIGH (ref 0.0–149.0)
VLDL: 36.8 mg/dL (ref 0.0–40.0)

## 2021-08-17 LAB — CBC
HCT: 45.4 % (ref 39.0–52.0)
Hemoglobin: 15.2 g/dL (ref 13.0–17.0)
MCHC: 33.5 g/dL (ref 30.0–36.0)
MCV: 99.6 fl (ref 78.0–100.0)
Platelets: 189 10*3/uL (ref 150.0–400.0)
RBC: 4.55 Mil/uL (ref 4.22–5.81)
RDW: 12.9 % (ref 11.5–15.5)
WBC: 8.4 10*3/uL (ref 4.0–10.5)

## 2021-08-17 LAB — COMPREHENSIVE METABOLIC PANEL
ALT: 17 U/L (ref 0–53)
AST: 17 U/L (ref 0–37)
Albumin: 4.7 g/dL (ref 3.5–5.2)
Alkaline Phosphatase: 74 U/L (ref 39–117)
BUN: 18 mg/dL (ref 6–23)
CO2: 30 mEq/L (ref 19–32)
Calcium: 9.7 mg/dL (ref 8.4–10.5)
Chloride: 100 mEq/L (ref 96–112)
Creatinine, Ser: 0.81 mg/dL (ref 0.40–1.50)
GFR: 100.2 mL/min (ref >60.00)
Glucose, Bld: 85 mg/dL (ref 70–99)
Potassium: 4 mEq/L (ref 3.5–5.1)
Sodium: 137 mEq/L (ref 135–145)
Total Bilirubin: 0.5 mg/dL (ref 0.2–1.2)
Total Protein: 7.2 g/dL (ref 6.0–8.3)

## 2021-08-17 LAB — HEMOGLOBIN A1C: Hgb A1c MFr Bld: 6.2 % (ref 4.6–6.5)

## 2021-08-17 NOTE — Assessment & Plan Note (Signed)
BP at goal on atenolol 50 mg daily and losartan/hctz 100/25 mg daily. Past stroke making goal <130/80. Checking CMP and adjust as needed.

## 2021-08-17 NOTE — Assessment & Plan Note (Signed)
Still using some alcohol and reinforced safe limits.

## 2021-08-17 NOTE — Assessment & Plan Note (Signed)
Flu shot declines. Covid-19 declines booster. Shingrix declines. Tetanus declines. Colonoscopy declines. Counseled about sun safety and mole surveillance. Counseled about the dangers of distracted driving. Given 10 year screening recommendations.

## 2021-08-17 NOTE — Assessment & Plan Note (Signed)
Checking lipid panel and adjust lipitor 40 mg daily as needed. 

## 2021-08-17 NOTE — Assessment & Plan Note (Signed)
Unable to make quit attempt today. Counseled to quit and risks/harms from smoking.

## 2021-08-17 NOTE — Assessment & Plan Note (Signed)
Checking lipid panel, HgA1c, BP at goal today. Counseled to stop smoking to lower risk for future stroke.

## 2021-09-04 ENCOUNTER — Other Ambulatory Visit: Payer: Self-pay | Admitting: Internal Medicine

## 2021-11-05 ENCOUNTER — Other Ambulatory Visit: Payer: Self-pay | Admitting: Internal Medicine

## 2021-11-15 ENCOUNTER — Other Ambulatory Visit: Payer: Self-pay | Admitting: Internal Medicine

## 2022-03-13 ENCOUNTER — Other Ambulatory Visit: Payer: Self-pay | Admitting: Internal Medicine

## 2022-08-07 ENCOUNTER — Other Ambulatory Visit: Payer: Self-pay | Admitting: Internal Medicine

## 2022-08-16 ENCOUNTER — Encounter: Payer: Self-pay | Admitting: Internal Medicine

## 2022-08-16 ENCOUNTER — Ambulatory Visit: Payer: 59 | Admitting: Internal Medicine

## 2022-08-16 VITALS — BP 160/100 | HR 64 | Temp 98.0°F | Ht 72.0 in | Wt 286.0 lb

## 2022-08-16 DIAGNOSIS — I10 Essential (primary) hypertension: Secondary | ICD-10-CM

## 2022-08-16 DIAGNOSIS — Z0000 Encounter for general adult medical examination without abnormal findings: Secondary | ICD-10-CM | POA: Diagnosis not present

## 2022-08-16 DIAGNOSIS — Z8673 Personal history of transient ischemic attack (TIA), and cerebral infarction without residual deficits: Secondary | ICD-10-CM | POA: Diagnosis not present

## 2022-08-16 DIAGNOSIS — Z1211 Encounter for screening for malignant neoplasm of colon: Secondary | ICD-10-CM

## 2022-08-16 DIAGNOSIS — E785 Hyperlipidemia, unspecified: Secondary | ICD-10-CM

## 2022-08-16 DIAGNOSIS — F17200 Nicotine dependence, unspecified, uncomplicated: Secondary | ICD-10-CM

## 2022-08-16 DIAGNOSIS — F1020 Alcohol dependence, uncomplicated: Secondary | ICD-10-CM

## 2022-08-16 DIAGNOSIS — R5383 Other fatigue: Secondary | ICD-10-CM

## 2022-08-16 LAB — CBC
HCT: 43.2 % (ref 39.0–52.0)
Hemoglobin: 14.7 g/dL (ref 13.0–17.0)
MCHC: 33.9 g/dL (ref 30.0–36.0)
MCV: 99.1 fl (ref 78.0–100.0)
Platelets: 206 10*3/uL (ref 150.0–400.0)
RBC: 4.36 Mil/uL (ref 4.22–5.81)
RDW: 13.2 % (ref 11.5–15.5)
WBC: 7.7 10*3/uL (ref 4.0–10.5)

## 2022-08-16 LAB — LIPID PANEL
Cholesterol: 188 mg/dL (ref 0–200)
HDL: 55.6 mg/dL
LDL Cholesterol: 108 mg/dL — ABNORMAL HIGH (ref 0–99)
NonHDL: 132.77
Total CHOL/HDL Ratio: 3
Triglycerides: 126 mg/dL (ref 0.0–149.0)
VLDL: 25.2 mg/dL (ref 0.0–40.0)

## 2022-08-16 LAB — COMPREHENSIVE METABOLIC PANEL
ALT: 18 U/L (ref 0–53)
AST: 17 U/L (ref 0–37)
Albumin: 4.6 g/dL (ref 3.5–5.2)
Alkaline Phosphatase: 77 U/L (ref 39–117)
BUN: 18 mg/dL (ref 6–23)
CO2: 32 mEq/L (ref 19–32)
Calcium: 9.3 mg/dL (ref 8.4–10.5)
Chloride: 101 mEq/L (ref 96–112)
Creatinine, Ser: 0.72 mg/dL (ref 0.40–1.50)
GFR: 103.11 mL/min (ref >60.00)
Glucose, Bld: 103 mg/dL — ABNORMAL HIGH (ref 70–99)
Potassium: 4 mEq/L (ref 3.5–5.1)
Sodium: 138 mEq/L (ref 135–145)
Total Bilirubin: 0.4 mg/dL (ref 0.2–1.2)
Total Protein: 7.1 g/dL (ref 6.0–8.3)

## 2022-08-16 NOTE — Patient Instructions (Signed)
We will get the levels checked today and the colon cancer screening will come in the mail to you.

## 2022-08-16 NOTE — Progress Notes (Unsigned)
   Subjective:   Patient ID: Anthony Harrington, male    DOB: Oct 12, 1965, 56 y.o.   MRN: [id number]  HPI The patient is a 56 YO man coming in for physical.   PMH, FMH, social history reviewed and updated  Review of Systems  Constitutional: Negative.   HENT: Negative.    Eyes: Negative.   Respiratory:  Negative for cough, chest tightness and shortness of breath.   Cardiovascular:  Negative for chest pain, palpitations and leg swelling.  Gastrointestinal:  Negative for abdominal distention, abdominal pain, constipation, diarrhea, nausea and vomiting.  Musculoskeletal: Negative.   Skin: Negative.   Neurological: Negative.   Psychiatric/Behavioral: Negative.      Objective:  Physical Exam Constitutional:      Appearance: He is well-developed.  HENT:     Head: Normocephalic and atraumatic.  Cardiovascular:     Rate and Rhythm: Normal rate and regular rhythm.  Pulmonary:     Effort: Pulmonary effort is normal. No respiratory distress.     Breath sounds: Normal breath sounds. No wheezing or rales.  Abdominal:     General: Bowel sounds are normal. There is no distension.     Palpations: Abdomen is soft.     Tenderness: There is no abdominal tenderness. There is no rebound.  Musculoskeletal:     Cervical back: Normal range of motion.  Skin:    General: Skin is warm and dry.  Neurological:     Mental Status: He is alert and oriented to person, place, and time.     Coordination: Coordination normal.     Vitals:   08/16/22 1517 08/16/22 1520 08/16/22 1550  BP: (!) 180/100 (!) 180/100 (!) 160/100  Pulse: 64    Temp: 98 F (36.7 C)    TempSrc: Oral    SpO2: 94%    Weight: 286 lb (129.7 kg)    Height: 6' (1.829 m)      Assessment & Plan:

## 2022-08-17 LAB — TESTOSTERONE TOTAL,FREE,BIO, MALES
Albumin: 4.5 g/dL (ref 3.6–5.1)
Sex Hormone Binding: 34 nmol/L (ref 10–50)
Testosterone: 172 ng/dL — ABNORMAL LOW (ref 250–827)

## 2022-08-17 LAB — HEMOGLOBIN A1C: Hgb A1c MFr Bld: 6.2 % (ref 4.6–6.5)

## 2022-08-17 NOTE — Assessment & Plan Note (Signed)
No new signs of stroke. Checking lipid panel and HgA1c and CMP. BP high today and he is asked to return for close follow up. Reminded of risk for stroke from alcohol use and smoking and he does not want to make changes today.

## 2022-08-17 NOTE — Assessment & Plan Note (Signed)
Checking lipid panel and adjust lipitor 40 mg daily as needed for goal LDL <70. ?

## 2022-08-17 NOTE — Assessment & Plan Note (Signed)
BMI 38 and complicated by high blood pressure, past stroke, hyperlipidemia. Counseled about diet and exercise.

## 2022-08-17 NOTE — Assessment & Plan Note (Signed)
Counseled to quit and he declines today. Reminded about risk/harm from smoking.

## 2022-08-17 NOTE — Assessment & Plan Note (Signed)
Flu shot declines. Covid-19 counseled. Shingrix declines. Tetanus declines. Cologuard ordered. Counseled about sun safety and mole surveillance. Counseled about the dangers of distracted driving. Given 10 year screening recommendations.

## 2022-08-17 NOTE — Assessment & Plan Note (Signed)
Still using alcohol and not wanting to make changes today.

## 2022-08-17 NOTE — Assessment & Plan Note (Signed)
BP is high today and he has not taken meds yet today but is taking them lately. He is not sure if this is at goal on meds. Previous at goal on meds. Asked to return in 1-2 months for recheck on meds. Checking CMP and adjust as needed. For now continue losartan/hctz 100/25 mg and atenolol 50 mg daily for BP.

## 2022-08-18 ENCOUNTER — Other Ambulatory Visit: Payer: Self-pay | Admitting: Internal Medicine

## 2022-08-18 DIAGNOSIS — R5383 Other fatigue: Secondary | ICD-10-CM

## 2022-08-19 ENCOUNTER — Other Ambulatory Visit: Payer: 59

## 2022-08-19 DIAGNOSIS — R5383 Other fatigue: Secondary | ICD-10-CM

## 2022-08-20 LAB — TESTOSTERONE TOTAL,FREE,BIO, MALES
Albumin: 4.6 g/dL (ref 3.6–5.1)
Sex Hormone Binding: 37 nmol/L (ref 10–50)
Testosterone: 131 ng/dL — ABNORMAL LOW (ref 250–827)

## 2022-08-23 ENCOUNTER — Other Ambulatory Visit: Payer: Self-pay | Admitting: Internal Medicine

## 2022-08-23 DIAGNOSIS — R7989 Other specified abnormal findings of blood chemistry: Secondary | ICD-10-CM

## 2022-08-23 MED ORDER — CLOMID 50 MG PO TABS: 50.0000 mg | ORAL_TABLET | Freq: Every day | ORAL | 1 refills | Status: DC

## 2022-08-24 ENCOUNTER — Telehealth: Payer: Self-pay | Admitting: Internal Medicine

## 2022-08-24 MED ORDER — TESTOSTERONE 12.5 MG/ACT (1%) TD GEL: 1.0000 | Freq: Every day | TRANSDERMAL | 0 refills | Status: DC

## 2022-08-24 NOTE — Telephone Encounter (Signed)
Pt called to let PCP know CLOMIPHENE is not covered by his insurance and he cannot afford to pay for it. Is there an alternate medication he can get? Pt ph [phone]

## 2022-08-24 NOTE — Telephone Encounter (Signed)
Called patient and let him know to pick up prescription that was called in to him and let us know if his insurance covers it or not

## 2022-08-24 NOTE — Telephone Encounter (Signed)
Sent in testosterone gel, use 1 pump daily. Make sure to wash hands well after applying. Same recheck levels in 4-6 weeks.

## 2022-09-08 LAB — COLOGUARD: COLOGUARD: POSITIVE — AB

## 2022-09-09 ENCOUNTER — Other Ambulatory Visit: Payer: Self-pay | Admitting: Internal Medicine

## 2022-09-09 DIAGNOSIS — R195 Other fecal abnormalities: Secondary | ICD-10-CM

## 2022-10-03 ENCOUNTER — Other Ambulatory Visit: Payer: Self-pay | Admitting: Internal Medicine

## 2022-10-18 ENCOUNTER — Encounter: Payer: Self-pay | Admitting: Internal Medicine

## 2022-10-18 ENCOUNTER — Ambulatory Visit: Payer: 59 | Admitting: Internal Medicine

## 2022-10-18 VITALS — BP 160/120 | HR 75 | Temp 97.8°F | Ht 72.0 in | Wt 291.0 lb

## 2022-10-18 DIAGNOSIS — I10 Essential (primary) hypertension: Secondary | ICD-10-CM | POA: Diagnosis not present

## 2022-10-18 DIAGNOSIS — R7989 Other specified abnormal findings of blood chemistry: Secondary | ICD-10-CM | POA: Insufficient documentation

## 2022-10-18 MED ORDER — LOSARTAN POTASSIUM-HCTZ 100-25 MG PO TABS: 1.0000 | ORAL_TABLET | Freq: Every day | ORAL | 3 refills | Status: DC

## 2022-10-18 MED ORDER — TESTOSTERONE CYPIONATE 200 MG/ML IM SOLN: 100.0000 mg | INTRAMUSCULAR | 0 refills | Status: DC

## 2022-10-18 MED ORDER — ATENOLOL 50 MG PO TABS: 50.0000 mg | ORAL_TABLET | Freq: Every day | ORAL | 3 refills | Status: DC

## 2022-10-18 MED ORDER — "BD DISP NEEDLES 16G X 1-1/2"" MISC": 0 refills | Status: DC

## 2022-10-18 MED ORDER — AMLODIPINE BESYLATE 10 MG PO TABS: 10.0000 mg | ORAL_TABLET | Freq: Every day | ORAL | 1 refills | Status: DC

## 2022-10-18 NOTE — Patient Instructions (Signed)
We have sent in the amlodipine to take 1 pill daily to help bring blood pressure down.  We have also sent in the testosterone to take 0.5 mL injection every 2 weeks. We will recheck the levels in 1-2 months when you come back.

## 2022-10-18 NOTE — Assessment & Plan Note (Signed)
BP is running high and given past stroke needs better control. Rx amlodipine 10 mg daily and will continue atenolol 50 mg daily and losartan/hctz 100/25 mg daily. Follow up 1-2 months for BP check.

## 2022-10-18 NOTE — Assessment & Plan Note (Signed)
Rx testosterone and will start with 100 mg every 2 weeks and follow up 1-2 months for recheck levels.

## 2022-10-18 NOTE — Progress Notes (Signed)
   Subjective:   Patient ID: Anthony Harrington, male    DOB: 19-Jan-1966, 57 y.o.   MRN: [id number]  HPI The patient is a 57 YO man coming in for follow up. BP still running high on meds at home. Also could not get testosterone due to cost.  Review of Systems  Constitutional:  Positive for fatigue.  HENT: Negative.    Eyes: Negative.   Respiratory:  Negative for cough, chest tightness and shortness of breath.   Cardiovascular:  Negative for chest pain, palpitations and leg swelling.  Gastrointestinal:  Negative for abdominal distention, abdominal pain, constipation, diarrhea, nausea and vomiting.  Musculoskeletal: Negative.   Skin: Negative.   Neurological: Negative.   Psychiatric/Behavioral: Negative.      Objective:  Physical Exam Constitutional:      Appearance: He is well-developed. He is obese.  HENT:     Head: Normocephalic and atraumatic.  Cardiovascular:     Rate and Rhythm: Normal rate and regular rhythm.  Pulmonary:     Effort: Pulmonary effort is normal. No respiratory distress.     Breath sounds: Normal breath sounds. No wheezing or rales.  Abdominal:     General: Bowel sounds are normal. There is no distension.     Palpations: Abdomen is soft.     Tenderness: There is no abdominal tenderness. There is no rebound.  Musculoskeletal:     Cervical back: Normal range of motion.  Skin:    General: Skin is warm and dry.  Neurological:     Mental Status: He is alert and oriented to person, place, and time.     Coordination: Coordination normal.     Vitals:   10/18/22 1448 10/18/22 1451  BP: (!) 160/120 (!) 160/120  Pulse: 75   Temp: 97.8 F (36.6 C)   TempSrc: Oral   SpO2: 94%   Weight: 291 lb (132 kg)   Height: 6' (1.829 m)     Assessment & Plan:

## 2022-11-14 ENCOUNTER — Other Ambulatory Visit: Payer: Self-pay | Admitting: Internal Medicine

## 2022-12-19 ENCOUNTER — Ambulatory Visit: Payer: 59 | Admitting: Internal Medicine

## 2022-12-19 ENCOUNTER — Encounter: Payer: Self-pay | Admitting: Internal Medicine

## 2022-12-19 VITALS — BP 150/100 | HR 82 | Temp 98.0°F | Ht 72.0 in | Wt 296.0 lb

## 2022-12-19 DIAGNOSIS — I10 Essential (primary) hypertension: Secondary | ICD-10-CM | POA: Diagnosis not present

## 2022-12-19 DIAGNOSIS — R7989 Other specified abnormal findings of blood chemistry: Secondary | ICD-10-CM | POA: Diagnosis not present

## 2022-12-19 LAB — CBC
HCT: 45.7 % (ref 39.0–52.0)
Hemoglobin: 15.4 g/dL (ref 13.0–17.0)
MCHC: 33.8 g/dL (ref 30.0–36.0)
MCV: 97 fl (ref 78.0–100.0)
Platelets: 238 10*3/uL (ref 150.0–400.0)
RBC: 4.71 Mil/uL (ref 4.22–5.81)
RDW: 13.2 % (ref 11.5–15.5)
WBC: 8.2 10*3/uL (ref 4.0–10.5)

## 2022-12-19 LAB — COMPREHENSIVE METABOLIC PANEL
ALT: 23 U/L (ref 0–53)
AST: 19 U/L (ref 0–37)
Albumin: 4.6 g/dL (ref 3.5–5.2)
Alkaline Phosphatase: 89 U/L (ref 39–117)
BUN: 14 mg/dL (ref 6–23)
CO2: 29 mEq/L (ref 19–32)
Calcium: 10 mg/dL (ref 8.4–10.5)
Chloride: 97 mEq/L (ref 96–112)
Creatinine, Ser: 0.77 mg/dL (ref 0.40–1.50)
GFR: 100.79 mL/min (ref >60.00)
Glucose, Bld: 110 mg/dL — ABNORMAL HIGH (ref 70–99)
Potassium: 3.8 mEq/L (ref 3.5–5.1)
Sodium: 137 mEq/L (ref 135–145)
Total Bilirubin: 0.6 mg/dL (ref 0.2–1.2)
Total Protein: 7.6 g/dL (ref 6.0–8.3)

## 2022-12-19 MED ORDER — ATENOLOL 100 MG PO TABS: 100.0000 mg | ORAL_TABLET | Freq: Every day | ORAL | 3 refills | Status: DC

## 2022-12-19 NOTE — Patient Instructions (Signed)
We will recheck the testosterone levels today.  We have increased the atenolol to 100 mg daily. You can take 2 pills daily of the atenolol 50 mg you have until you run out. Keep the other BP medications the same.

## 2022-12-19 NOTE — Assessment & Plan Note (Signed)
No change since starting 100 mg every 14 days IM testosterone. Checking levels today about 1 week since last injection and CBC. Adjust as needed.

## 2022-12-19 NOTE — Assessment & Plan Note (Signed)
BP is still moderately elevated even with addition of amlodipine 10 mg daily. Taking amlodipine 10 mg daily and atenolol 50 mg daily and losartan/hctz 100/25 mg daily. Will increase atenolol to 100 mg daily today. Checking CMP. Still smoking and drinking alcohol and advised that these can both raise BP. He is unable to stop or slow down on either at this time.

## 2022-12-19 NOTE — Progress Notes (Signed)
   Subjective:   Patient ID: Anthony Harrington, male    DOB: [birth year], 57 y.o.   MRN: [id number]  HPI The patient is a 57 YO man coming in for follow up.  Review of Systems  Constitutional: Negative.   HENT: Negative.    Eyes: Negative.   Respiratory:  Negative for cough, chest tightness and shortness of breath.   Cardiovascular:  Negative for chest pain, palpitations and leg swelling.  Gastrointestinal:  Negative for abdominal distention, abdominal pain, constipation, diarrhea, nausea and vomiting.  Musculoskeletal: Negative.   Skin: Negative.   Neurological: Negative.   Psychiatric/Behavioral: Negative.      Objective:  Physical Exam Constitutional:      Appearance: He is well-developed. He is obese.  HENT:     Head: Normocephalic and atraumatic.  Cardiovascular:     Rate and Rhythm: Normal rate and regular rhythm.  Pulmonary:     Effort: Pulmonary effort is normal. No respiratory distress.     Breath sounds: Normal breath sounds. No wheezing or rales.  Abdominal:     General: Bowel sounds are normal. There is no distension.     Palpations: Abdomen is soft.     Tenderness: There is no abdominal tenderness. There is no rebound.  Musculoskeletal:     Cervical back: Normal range of motion.  Skin:    General: Skin is warm and dry.  Neurological:     Mental Status: He is alert and oriented to person, place, and time.     Coordination: Coordination normal.     Vitals:   12/19/22 1324 12/19/22 1338  BP: (!) 150/110 (!) 150/100  Pulse: 82   Temp: 98 F (36.7 C)   TempSrc: Oral   SpO2: 94%   Weight: 296 lb (134.3 kg)   Height: 6' (1.829 m)     Assessment & Plan:

## 2022-12-20 ENCOUNTER — Other Ambulatory Visit: Payer: Self-pay | Admitting: Internal Medicine

## 2022-12-20 LAB — TESTOSTERONE TOTAL,FREE,BIO, MALES
Albumin: 4.7 g/dL (ref 3.6–5.1)
Sex Hormone Binding: 29 nmol/L (ref 10–50)
Testosterone: 213 ng/dL — ABNORMAL LOW (ref 250–827)

## 2022-12-20 MED ORDER — TESTOSTERONE CYPIONATE 200 MG/ML IM SOLN: 150.0000 mg | INTRAMUSCULAR | 0 refills | Status: DC

## 2023-01-21 ENCOUNTER — Other Ambulatory Visit: Payer: Self-pay | Admitting: Internal Medicine

## 2023-01-23 NOTE — Telephone Encounter (Signed)
MD is out of the office until Wednesday pls advise on refill.Marland KitchenRaechel Chute

## 2023-02-21 ENCOUNTER — Other Ambulatory Visit: Payer: Self-pay | Admitting: Internal Medicine

## 2023-03-22 ENCOUNTER — Other Ambulatory Visit: Payer: Self-pay | Admitting: Internal Medicine

## 2023-03-28 ENCOUNTER — Other Ambulatory Visit: Payer: Self-pay | Admitting: Internal Medicine

## 2023-04-09 ENCOUNTER — Other Ambulatory Visit: Payer: Self-pay | Admitting: Internal Medicine

## 2023-04-25 ENCOUNTER — Other Ambulatory Visit: Payer: Self-pay | Admitting: Internal Medicine

## 2023-05-18 ENCOUNTER — Encounter (INDEPENDENT_AMBULATORY_CARE_PROVIDER_SITE_OTHER): Payer: Self-pay

## 2023-05-31 ENCOUNTER — Other Ambulatory Visit: Payer: Self-pay | Admitting: Internal Medicine

## 2023-06-21 ENCOUNTER — Ambulatory Visit: Payer: 59 | Admitting: Internal Medicine

## 2023-06-21 ENCOUNTER — Ambulatory Visit (INDEPENDENT_AMBULATORY_CARE_PROVIDER_SITE_OTHER): Payer: 59 | Admitting: Internal Medicine

## 2023-06-21 ENCOUNTER — Encounter: Payer: Self-pay | Admitting: Internal Medicine

## 2023-06-21 VITALS — BP 138/84 | HR 60 | Temp 98.3°F | Ht 72.0 in | Wt 273.0 lb

## 2023-06-21 DIAGNOSIS — Z8673 Personal history of transient ischemic attack (TIA), and cerebral infarction without residual deficits: Secondary | ICD-10-CM

## 2023-06-21 DIAGNOSIS — F17200 Nicotine dependence, unspecified, uncomplicated: Secondary | ICD-10-CM

## 2023-06-21 DIAGNOSIS — F1020 Alcohol dependence, uncomplicated: Secondary | ICD-10-CM

## 2023-06-21 DIAGNOSIS — R7989 Other specified abnormal findings of blood chemistry: Secondary | ICD-10-CM | POA: Diagnosis not present

## 2023-06-21 DIAGNOSIS — R195 Other fecal abnormalities: Secondary | ICD-10-CM

## 2023-06-21 DIAGNOSIS — I10 Essential (primary) hypertension: Secondary | ICD-10-CM | POA: Diagnosis not present

## 2023-06-21 DIAGNOSIS — E785 Hyperlipidemia, unspecified: Secondary | ICD-10-CM | POA: Diagnosis not present

## 2023-06-21 DIAGNOSIS — G40909 Epilepsy, unspecified, not intractable, without status epilepticus: Secondary | ICD-10-CM

## 2023-06-21 LAB — COMPREHENSIVE METABOLIC PANEL WITH GFR
ALT: 28 U/L (ref 0–53)
AST: 20 U/L (ref 0–37)
Albumin: 4.6 g/dL (ref 3.5–5.2)
Alkaline Phosphatase: 86 U/L (ref 39–117)
BUN: 17 mg/dL (ref 6–23)
CO2: 29 meq/L (ref 19–32)
Calcium: 9.8 mg/dL (ref 8.4–10.5)
Chloride: 101 meq/L (ref 96–112)
Creatinine, Ser: 0.81 mg/dL (ref 0.40–1.50)
GFR: 98.91 mL/min (ref >60.00)
Glucose, Bld: 113 mg/dL — ABNORMAL HIGH (ref 70–99)
Potassium: 3.8 meq/L (ref 3.5–5.1)
Sodium: 136 meq/L (ref 135–145)
Total Bilirubin: 0.5 mg/dL (ref 0.2–1.2)
Total Protein: 7.1 g/dL (ref 6.0–8.3)

## 2023-06-21 LAB — TSH: TSH: 1.73 u[IU]/mL (ref 0.35–5.50)

## 2023-06-21 LAB — CBC
HCT: 43.2 % (ref 39.0–52.0)
Hemoglobin: 14.4 g/dL (ref 13.0–17.0)
MCHC: 33.3 g/dL (ref 30.0–36.0)
MCV: 97.1 fl (ref 78.0–100.0)
Platelets: 259 10*3/uL (ref 150.0–400.0)
RBC: 4.45 Mil/uL (ref 4.22–5.81)
RDW: 13 % (ref 11.5–15.5)
WBC: 7.6 10*3/uL (ref 4.0–10.5)

## 2023-06-21 LAB — LIPID PANEL
Cholesterol: 153 mg/dL (ref 0–200)
HDL: 44.1 mg/dL (ref >39.00)
LDL Cholesterol: 86 mg/dL (ref 0–99)
NonHDL: 108.65
Total CHOL/HDL Ratio: 3
Triglycerides: 115 mg/dL (ref 0.0–149.0)
VLDL: 23 mg/dL (ref 0.0–40.0)

## 2023-06-21 LAB — HEMOGLOBIN A1C: Hgb A1c MFr Bld: 6.2 % (ref 4.6–6.5)

## 2023-06-21 LAB — VITAMIN B12: Vitamin B-12: 441 pg/mL (ref 211–911)

## 2023-06-21 LAB — VITAMIN D 25 HYDROXY (VIT D DEFICIENCY, FRACTURES): VITD: 16.22 ng/mL — ABNORMAL LOW (ref 30.00–100.00)

## 2023-06-21 NOTE — Assessment & Plan Note (Signed)
Checking testosterone levels and adjust as needed. Still having fatigue and testosterone replacement has not helped with this.

## 2023-06-21 NOTE — Assessment & Plan Note (Signed)
Still using alcohol and not amenable to decrease today.

## 2023-06-21 NOTE — Patient Instructions (Addendum)
Please call our office at [phone] and choose option 1, to get this appointment scheduled for colonoscopy.  When you refill the atenolol it should be the new dose of 100 mg daily so just take one.  We will check the labs today.

## 2023-06-21 NOTE — Assessment & Plan Note (Signed)
BMi 37 and complicated by hyperlipidemia and hypertension. Counseled on diet and exercise.

## 2023-06-21 NOTE — Assessment & Plan Note (Signed)
Checking lipid panel and adjust lipitor 40 mg daily as needed.

## 2023-06-21 NOTE — Assessment & Plan Note (Signed)
Not amenable to quitting at this time. Counseled on risk/harm.

## 2023-06-21 NOTE — Assessment & Plan Note (Signed)
BP improved on new regimen. Keep atenolol 100 mg daily and amlodipine 10 mg daily and losartan/hydrochlorothiazide 100/25 mg daily. Checking CMP and adjust as needed.

## 2023-06-21 NOTE — Assessment & Plan Note (Signed)
Referral placed and reminded to call [facility] and given their number/information. Talked about the importance of this.

## 2023-06-21 NOTE — Progress Notes (Signed)
Subjective:   Patient ID: Anthony Harrington, male    DOB: 03-Apr-1966, 57 y.o.   MRN: [id number]  HPI The patient is a 57 YO man coming in for follow up multiple things. Still feeling fatigued and testosterone injections have not helped this.  Review of Systems  Constitutional:  Positive for fatigue.  HENT: Negative.    Eyes: Negative.   Respiratory:  Negative for cough, chest tightness and shortness of breath.   Cardiovascular:  Negative for chest pain, palpitations and leg swelling.  Gastrointestinal:  Negative for abdominal distention, abdominal pain, constipation, diarrhea, nausea and vomiting.  Musculoskeletal: Negative.   Skin: Negative.   Neurological: Negative.   Psychiatric/Behavioral: Negative.      Objective:  Physical Exam Constitutional:      Appearance: He is well-developed.  HENT:     Head: Normocephalic and atraumatic.  Cardiovascular:     Rate and Rhythm: Normal rate and regular rhythm.  Pulmonary:     Effort: Pulmonary effort is normal. No respiratory distress.     Breath sounds: Normal breath sounds. No wheezing or rales.  Abdominal:     General: Bowel sounds are normal. There is no distension.     Palpations: Abdomen is soft.     Tenderness: There is no abdominal tenderness. There is no rebound.  Musculoskeletal:     Cervical back: Normal range of motion.  Skin:    General: Skin is warm and dry.  Neurological:     Mental Status: He is alert and oriented to person, place, and time.     Coordination: Coordination normal.     Vitals:   06/21/23 1039  BP: 138/84  Pulse: 60  Temp: 98.3 F (36.8 C)  TempSrc: Oral  SpO2: 94%  Weight: 273 lb (123.8 kg)  Height: 6' (1.829 m)    Assessment & Plan:

## 2023-06-21 NOTE — Assessment & Plan Note (Signed)
No recent seizure and may have been withdrawal. Not on medication. Continue to monitor.

## 2023-06-22 LAB — TESTOSTERONE TOTAL,FREE,BIO, MALES
Albumin: 4.7 g/dL (ref 3.6–5.1)
Sex Hormone Binding: 25 nmol/L (ref 22–77)
Testosterone: 195 ng/dL — ABNORMAL LOW (ref 250–827)

## 2023-06-28 ENCOUNTER — Telehealth: Payer: Self-pay | Admitting: Internal Medicine

## 2023-06-28 MED ORDER — TESTOSTERONE CYPIONATE 200 MG/ML IM SOLN: 200.0000 mg | INTRAMUSCULAR | 0 refills | Status: DC

## 2023-06-28 NOTE — Telephone Encounter (Signed)
Refilled with updating dosing

## 2023-06-28 NOTE — Telephone Encounter (Signed)
FYI..  Pt lab results was given and pt understood.

## 2023-06-28 NOTE — Telephone Encounter (Signed)
  Prescription Request  06/28/2023  LOV: 06/21/2023  What is the name of the medication or equipment?  testosterone cypionate (DEPOTESTOSTERONE CYPIONATE) 200 MG/ML injection [   Have you contacted your pharmacy to request a refill? No   Which pharmacy would you like this sent to?     [facility] [address] ([address]), [place] [address] [place]) [place] [address] Phone: [phone] Fax: [phone]  Patient notified that their request is being sent to the clinical staff for review and that they should receive a response within 2 business days.   Please advise at Mobile [phone] (mobile)

## 2023-06-29 ENCOUNTER — Other Ambulatory Visit: Payer: Self-pay | Admitting: Internal Medicine

## 2023-07-04 ENCOUNTER — Other Ambulatory Visit: Payer: Self-pay | Admitting: Internal Medicine

## 2023-08-02 ENCOUNTER — Other Ambulatory Visit: Payer: Self-pay | Admitting: Internal Medicine

## 2023-08-16 ENCOUNTER — Other Ambulatory Visit: Payer: Self-pay | Admitting: Internal Medicine

## 2023-08-25 ENCOUNTER — Encounter: Payer: 59 | Admitting: Internal Medicine

## 2023-08-25 ENCOUNTER — Encounter: Payer: Self-pay | Admitting: Internal Medicine

## 2023-08-25 ENCOUNTER — Ambulatory Visit (INDEPENDENT_AMBULATORY_CARE_PROVIDER_SITE_OTHER): Payer: 59 | Admitting: Internal Medicine

## 2023-08-25 VITALS — BP 138/88 | HR 62 | Temp 98.3°F | Ht 72.0 in | Wt 273.0 lb

## 2023-08-25 DIAGNOSIS — R195 Other fecal abnormalities: Secondary | ICD-10-CM | POA: Diagnosis not present

## 2023-08-25 DIAGNOSIS — F17200 Nicotine dependence, unspecified, uncomplicated: Secondary | ICD-10-CM

## 2023-08-25 DIAGNOSIS — F1020 Alcohol dependence, uncomplicated: Secondary | ICD-10-CM

## 2023-08-25 DIAGNOSIS — R7989 Other specified abnormal findings of blood chemistry: Secondary | ICD-10-CM

## 2023-08-25 DIAGNOSIS — Z0000 Encounter for general adult medical examination without abnormal findings: Secondary | ICD-10-CM | POA: Diagnosis not present

## 2023-08-25 DIAGNOSIS — E785 Hyperlipidemia, unspecified: Secondary | ICD-10-CM

## 2023-08-25 MED ORDER — ATORVASTATIN CALCIUM 40 MG PO TABS: 40.0000 mg | ORAL_TABLET | Freq: Every day | ORAL | 3 refills | Status: DC

## 2023-08-25 MED ORDER — AMLODIPINE BESYLATE 10 MG PO TABS: 10.0000 mg | ORAL_TABLET | Freq: Every day | ORAL | 0 refills | Status: DC

## 2023-08-25 MED ORDER — TESTOSTERONE CYPIONATE 200 MG/ML IM SOLN: 200.0000 mg | INTRAMUSCULAR | 0 refills | Status: DC

## 2023-08-25 MED ORDER — LOSARTAN POTASSIUM-HCTZ 100-25 MG PO TABS: 1.0000 | ORAL_TABLET | Freq: Every day | ORAL | 3 refills | Status: DC

## 2023-08-25 MED ORDER — ATENOLOL 100 MG PO TABS: 100.0000 mg | ORAL_TABLET | Freq: Every day | ORAL | 3 refills | Status: DC

## 2023-08-25 MED ORDER — ESCITALOPRAM OXALATE 10 MG PO TABS: 10.0000 mg | ORAL_TABLET | Freq: Every day | ORAL | 3 refills | Status: AC

## 2023-08-25 NOTE — Assessment & Plan Note (Signed)
Recent lipid panel at goal on atorvastatin 40 mg daily. Continue.

## 2023-08-25 NOTE — Assessment & Plan Note (Signed)
Counseled to quit and he is unable to make attempt at this time. Counseled about risk/harm from smoking.

## 2023-08-25 NOTE — Assessment & Plan Note (Signed)
Flu shot complete for season. Shingrix declines. Tetanus declines. Colonoscopy referral done. Counseled about sun safety and mole surveillance. Counseled about the dangers of distracted driving. Given 10 year screening recommendations.

## 2023-08-25 NOTE — Assessment & Plan Note (Signed)
Was unable to call GI and get scheduled. New referral done for positive cologuard he is willing now to do colonoscopy.

## 2023-08-25 NOTE — Progress Notes (Signed)
   Subjective:   Patient ID: Anthony Harrington, male    DOB: 06-06-1966, 57 y.o.   MRN: [id number]  HPI The patient is a 57 YO man coming coming in for physical.   PMH, FMH, social history reviewed and updated  Review of Systems  Constitutional:  Positive for fatigue.  HENT: Negative.    Eyes: Negative.   Respiratory:  Negative for cough, chest tightness and shortness of breath.   Cardiovascular:  Negative for chest pain, palpitations and leg swelling.  Gastrointestinal:  Negative for abdominal distention, abdominal pain, constipation, diarrhea, nausea and vomiting.  Musculoskeletal: Negative.   Skin: Negative.   Neurological: Negative.   Psychiatric/Behavioral: Negative.      Objective:  Physical Exam Constitutional:      Appearance: He is well-developed.  HENT:     Head: Normocephalic and atraumatic.  Cardiovascular:     Rate and Rhythm: Normal rate and regular rhythm.  Pulmonary:     Effort: Pulmonary effort is normal. No respiratory distress.     Breath sounds: Normal breath sounds. No wheezing or rales.  Abdominal:     General: Bowel sounds are normal. There is no distension.     Palpations: Abdomen is soft.     Tenderness: There is no abdominal tenderness. There is no rebound.  Musculoskeletal:     Cervical back: Normal range of motion.  Skin:    General: Skin is warm and dry.  Neurological:     Mental Status: He is alert and oriented to person, place, and time.     Coordination: Coordination normal.     Vitals:   08/25/23 0841 08/25/23 0849 08/25/23 0901  BP: (!) 140/100 (!) 140/100 138/88  Pulse: 62    Temp: 98.3 F (36.8 C)    TempSrc: Oral    SpO2: 96%    Weight: 273 lb (123.8 kg)    Height: 6' (1.829 m)      Assessment & Plan:

## 2023-08-25 NOTE — Assessment & Plan Note (Signed)
Has stopped alcohol since earlier this year. Advised to stay off alcohol.

## 2023-08-25 NOTE — Assessment & Plan Note (Signed)
We did increase dosing recently and he is not noticing much difference. Rechecking levels today and adjust as needed.

## 2023-08-26 LAB — TESTOSTERONE TOTAL,FREE,BIO, MALES
Albumin: 4.8 g/dL (ref 3.6–5.1)
Sex Hormone Binding: 25 nmol/L (ref 22–77)
Testosterone: 200 ng/dL — ABNORMAL LOW (ref 250–827)

## 2023-08-28 MED ORDER — TESTOSTERONE CYPIONATE 200 MG/ML IM SOLN: 200.0000 mg | INTRAMUSCULAR | 0 refills | Status: DC

## 2023-08-28 NOTE — Telephone Encounter (Signed)
Please send in medication for patient

## 2023-11-15 ENCOUNTER — Other Ambulatory Visit: Payer: Self-pay | Admitting: Internal Medicine

## 2024-02-13 ENCOUNTER — Other Ambulatory Visit: Payer: Self-pay | Admitting: Internal Medicine

## 2024-02-22 ENCOUNTER — Encounter: Payer: Self-pay | Admitting: Pediatrics

## 2024-02-23 ENCOUNTER — Encounter: Payer: Self-pay | Admitting: Internal Medicine

## 2024-02-23 ENCOUNTER — Ambulatory Visit (INDEPENDENT_AMBULATORY_CARE_PROVIDER_SITE_OTHER): Payer: 59 | Admitting: Internal Medicine

## 2024-02-23 VITALS — BP 134/82 | HR 66 | Temp 98.1°F | Ht 72.0 in | Wt 273.0 lb

## 2024-02-23 DIAGNOSIS — E785 Hyperlipidemia, unspecified: Secondary | ICD-10-CM

## 2024-02-23 DIAGNOSIS — R7303 Prediabetes: Secondary | ICD-10-CM | POA: Diagnosis not present

## 2024-02-23 DIAGNOSIS — F17200 Nicotine dependence, unspecified, uncomplicated: Secondary | ICD-10-CM | POA: Diagnosis not present

## 2024-02-23 DIAGNOSIS — F1020 Alcohol dependence, uncomplicated: Secondary | ICD-10-CM

## 2024-02-23 DIAGNOSIS — R0683 Snoring: Secondary | ICD-10-CM

## 2024-02-23 DIAGNOSIS — E559 Vitamin D deficiency, unspecified: Secondary | ICD-10-CM | POA: Diagnosis not present

## 2024-02-23 DIAGNOSIS — R7989 Other specified abnormal findings of blood chemistry: Secondary | ICD-10-CM

## 2024-02-23 LAB — COMPREHENSIVE METABOLIC PANEL WITH GFR
ALT: 23 U/L (ref 0–53)
AST: 16 U/L (ref 0–37)
Albumin: 4.6 g/dL (ref 3.5–5.2)
Alkaline Phosphatase: 88 U/L (ref 39–117)
BUN: 24 mg/dL — ABNORMAL HIGH (ref 6–23)
CO2: 28 meq/L (ref 19–32)
Calcium: 9.9 mg/dL (ref 8.4–10.5)
Chloride: 101 meq/L (ref 96–112)
Creatinine, Ser: 0.77 mg/dL (ref 0.40–1.50)
GFR: 99.96 mL/min (ref >60.00)
Glucose, Bld: 139 mg/dL — ABNORMAL HIGH (ref 70–99)
Potassium: 3.6 meq/L (ref 3.5–5.1)
Sodium: 137 meq/L (ref 135–145)
Total Bilirubin: 0.5 mg/dL (ref 0.2–1.2)
Total Protein: 7.1 g/dL (ref 6.0–8.3)

## 2024-02-23 LAB — LIPID PANEL
Cholesterol: 145 mg/dL (ref 0–200)
HDL: 38.6 mg/dL — ABNORMAL LOW (ref >39.00)
LDL Cholesterol: 84 mg/dL (ref 0–99)
NonHDL: 105.9
Total CHOL/HDL Ratio: 4
Triglycerides: 112 mg/dL (ref 0.0–149.0)
VLDL: 22.4 mg/dL (ref 0.0–40.0)

## 2024-02-23 LAB — HEMOGLOBIN A1C: Hgb A1c MFr Bld: 6.4 % (ref 4.6–6.5)

## 2024-02-23 LAB — VITAMIN D 25 HYDROXY (VIT D DEFICIENCY, FRACTURES): VITD: 19.45 ng/mL — ABNORMAL LOW (ref 30.00–100.00)

## 2024-02-23 NOTE — Patient Instructions (Signed)
 We will check the labs today.

## 2024-02-23 NOTE — Assessment & Plan Note (Signed)
 Stopped testosterone  injections as he did not see benefit. Levels were still not to therapeutic levels.

## 2024-02-23 NOTE — Assessment & Plan Note (Signed)
 Offered sleep test and he declines as he has significant daytime fatigue.

## 2024-02-23 NOTE — Assessment & Plan Note (Signed)
 Counseled to quit not able to make attempt today.

## 2024-02-23 NOTE — Assessment & Plan Note (Signed)
 Stopped alcohol for about 1 year now and has not seen any benefit but plans to continue off alcohol.

## 2024-02-23 NOTE — Assessment & Plan Note (Signed)
 Checking vitamin D  in follow up. Still fatigued did not get benefit from treatment.

## 2024-02-23 NOTE — Assessment & Plan Note (Signed)
 BMI 37 and complicated by pre-diabetes and hypertension.

## 2024-02-23 NOTE — Assessment & Plan Note (Signed)
Checking lipid panel and adjust lipitor as needed.  

## 2024-02-23 NOTE — Progress Notes (Signed)
   Subjective:   Patient ID: Anthony Harrington, male    DOB: [birth year], 58 y.o.   MRN: [id number]  HPI The patient is a 58 YO coming in for medical management. Stopped testosterone  shots as he did not notice any benefit from them.   Review of Systems  Constitutional:  Positive for fatigue.  HENT: Negative.    Eyes: Negative.   Respiratory:  Negative for cough, chest tightness and shortness of breath.   Cardiovascular:  Negative for chest pain, palpitations and leg swelling.  Gastrointestinal:  Negative for abdominal distention, abdominal pain, constipation, diarrhea, nausea and vomiting.  Musculoskeletal: Negative.   Skin: Negative.   Neurological: Negative.   Psychiatric/Behavioral: Negative.      Objective:  Physical Exam Constitutional:      Appearance: He is well-developed.  HENT:     Head: Normocephalic and atraumatic.  Cardiovascular:     Rate and Rhythm: Normal rate and regular rhythm.  Pulmonary:     Effort: Pulmonary effort is normal. No respiratory distress.     Breath sounds: Normal breath sounds. No wheezing or rales.  Abdominal:     General: Bowel sounds are normal. There is no distension.     Palpations: Abdomen is soft.     Tenderness: There is no abdominal tenderness. There is no rebound.  Musculoskeletal:     Cervical back: Normal range of motion.  Skin:    General: Skin is warm and dry.  Neurological:     Mental Status: He is alert and oriented to person, place, and time.     Coordination: Coordination normal.     Vitals:   02/23/24 0852  BP: 134/82  Pulse: 66  Temp: 98.1 F (36.7 C)  TempSrc: Oral  SpO2: 97%  Weight: 273 lb (123.8 kg)  Height: 6' (1.829 m)    Assessment & Plan:

## 2024-02-23 NOTE — Assessment & Plan Note (Signed)
 Checking Hga1C AND adjust as needed.

## 2024-02-27 ENCOUNTER — Ambulatory Visit: Payer: Self-pay | Admitting: Internal Medicine

## 2024-02-27 MED ORDER — VITAMIN D (ERGOCALCIFEROL) 1.25 MG (50000 UNIT) PO CAPS: 50000.0000 [IU] | ORAL_CAPSULE | ORAL | 0 refills | Status: DC

## 2024-03-26 ENCOUNTER — Ambulatory Visit

## 2024-03-26 ENCOUNTER — Encounter: Payer: Self-pay | Admitting: Pediatrics

## 2024-03-26 VITALS — Ht 72.0 in | Wt 270.0 lb

## 2024-03-26 DIAGNOSIS — Z1211 Encounter for screening for malignant neoplasm of colon: Secondary | ICD-10-CM

## 2024-03-26 MED ORDER — NA SULFATE-K SULFATE-MG SULF 17.5-3.13-1.6 GM/177ML PO SOLN: 1.0000 | Freq: Once | ORAL | 0 refills | Status: AC

## 2024-03-26 NOTE — Progress Notes (Signed)

## 2024-04-06 NOTE — Progress Notes (Unsigned)
 [facility] History and Physical   Primary Care Physician:  Rollene Almarie LABOR, MD   Reason for Procedure:  Positive Cologuard  Plan:    Colonoscopy     HPI: Anthony Harrington is a 58 y.o. male undergoing colonoscopy for investigation of positive Cologuard in 2023.  No prior history of colonoscopy.  No family history of colorectal cancer or polyps.  Patient denies current symptoms of rectal bleeding or change in bowel habits.   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   Hyperlipidemia    Hypertension    Seizures (HCC)    x 1 in his teen years    Past Surgical History:  Procedure Laterality Date   FRACTURE SURGERY     left hip   lumbar spine surgery  2003   microdiskectomy L5-S1 [facility])    Prior to Admission medications   Medication Sig Start Date End Date Taking? Authorizing Provider  amLODipine  (NORVASC ) 10 MG tablet Take 1 tablet by mouth once daily 02/14/24   Rollene Almarie LABOR, MD  aspirin  325 MG tablet Take 1 tablet (325 mg total) by mouth daily. 09/05/19   Onita Duos, MD  atenolol  (TENORMIN ) 100 MG tablet Take 1 tablet (100 mg total) by mouth daily. 08/25/23   Rollene Almarie LABOR, MD  atorvastatin  (LIPITOR) 40 MG tablet Take 1 tablet (40 mg total) by mouth daily. 08/25/23   Rollene Almarie LABOR, MD  escitalopram  (LEXAPRO ) 10 MG tablet Take 1 tablet (10 mg total) by mouth daily. 08/25/23   Rollene Almarie LABOR, MD  losartan -hydrochlorothiazide  (HYZAAR) 100-25 MG tablet Take 1 tablet by mouth daily. 08/25/23   Rollene Almarie LABOR, MD  Vitamin D , Ergocalciferol , (DRISDOL ) 1.25 MG (50000 UNIT) CAPS capsule Take 1 capsule (50,000 Units total) by mouth every 7 (seven) days. 02/27/24   Rollene Almarie LABOR, MD    Current Outpatient Medications  Medication Sig Dispense Refill   amLODipine  (NORVASC ) 10 MG tablet Take 1 tablet by mouth once daily 90  tablet 0   aspirin  325 MG tablet Take 1 tablet (325 mg total) by mouth daily. 90 tablet 4   atenolol  (TENORMIN ) 100 MG tablet Take 1 tablet (100 mg total) by mouth daily. 90 tablet 3   atorvastatin  (LIPITOR) 40 MG tablet Take 1 tablet (40 mg total) by mouth daily. 90 tablet 3   escitalopram  (LEXAPRO ) 10 MG tablet Take 1 tablet (10 mg total) by mouth daily. 90 tablet 3   losartan -hydrochlorothiazide  (HYZAAR) 100-25 MG tablet Take 1 tablet by mouth daily. 90 tablet 3   Vitamin D , Ergocalciferol , (DRISDOL ) 1.25 MG (50000 UNIT) CAPS capsule Take 1 capsule (50,000 Units total) by mouth every 7 (seven) days. 12 capsule 0   No current facility-administered medications for this visit.    Allergies as of 04/08/2024 - Review Complete 03/26/2024  Allergen Reaction Noted   Lisinopril Rash 06/26/2015    Family History  Problem Relation Age of Onset   Hypertension Mother    Diabetes Father    Heart disease Father    Hyperlipidemia Father    Stroke Father    Diabetes Brother    Heart disease Brother 38   COPD Neg Hx    Cancer Neg Hx    Colon cancer Neg Hx    Esophageal cancer Neg Hx    Rectal cancer Neg Hx    Stomach cancer Neg Hx  Social History   Socioeconomic History   Marital status: Media planner    Spouse name: Not on file   Number of children: 0   Years of education: 12   Highest education level: 12th grade  Occupational History   Occupation: Production designer, theatre/television/film  Tobacco Use   Smoking status: Every Day    Current packs/day: 2.00    Average packs/day: 2.0 packs/day for 36.0 years (72.0 ttl pk-yrs)    Types: Cigarettes   Smokeless tobacco: Never  Vaping Use   Vaping status: Never Used  Substance and Sexual Activity   Alcohol use: Not Currently    Alcohol/week: 14.0 standard drinks of alcohol    Types: 14 Standard drinks or equivalent per week    Comment: Stopped drinking after stroke - prior use 48 oz daily; does have a problem with ETOH,.   Drug use: No   Sexual activity:  Yes    Partners: Female  Other Topics Concern   Not on file  Social History Narrative   Work - [employer] - does a little of everything. Lives with his partner, Koren.  No daily caffeine use.  Right-handed.   Social Drivers of Health   Financial Resource Strain: Medium Risk (08/24/2023)   Overall Financial Resource Strain (CARDIA)    Difficulty of Paying Living Expenses: Somewhat hard  Food Insecurity: No Food Insecurity (08/24/2023)   Hunger Vital Sign    Worried About Running Out of Food in the Last Year: Never true    Ran Out of Food in the Last Year: Never true  Transportation Needs: No Transportation Needs (08/24/2023)   PRAPARE - Administrator, Civil Service (Medical): No    Lack of Transportation (Non-Medical): No  Physical Activity: Insufficiently Active (08/24/2023)   Exercise Vital Sign    Days of Exercise per Week: 1 day    Minutes of Exercise per Session: 10 min  Stress: No Stress Concern Present (08/24/2023)   [employer] of Occupational Health - Occupational Stress Questionnaire    Feeling of Stress : Only a little  Social Connections: Unknown (08/24/2023)   Social Connection and Isolation Panel    Frequency of Communication with Friends and Family: Once a week    Frequency of Social Gatherings with Friends and Family: Patient declined    Attends Religious Services: Patient declined    Database administrator or Organizations: No    Attends Engineer, structural: Not on file    Marital Status: Living with partner  Intimate Partner Violence: Not on file    Review of Systems:  All other review of systems negative except as mentioned in the HPI.  Physical Exam: Vital signs There were no vitals taken for this visit.  General:   Alert,  Well-developed, well-nourished, pleasant and cooperative in NAD Airway:  Mallampati  Lungs:  Clear throughout to auscultation.   Heart:  Regular rate and rhythm; no murmurs, clicks, rubs,  or  gallops. Abdomen:  Soft, nontender and nondistended. Normal bowel sounds.   Neuro/Psych:  Normal mood and affect. A and O x 3  Inocente Hausen, MD [facility]

## 2024-04-08 ENCOUNTER — Ambulatory Visit (AMBULATORY_SURGERY_CENTER): Admitting: Pediatrics

## 2024-04-08 ENCOUNTER — Encounter: Payer: Self-pay | Admitting: Pediatrics

## 2024-04-08 VITALS — BP 155/92 | HR 53 | Temp 98.4°F | Resp 9 | Ht 72.0 in | Wt 270.0 lb

## 2024-04-08 DIAGNOSIS — K635 Polyp of colon: Secondary | ICD-10-CM

## 2024-04-08 DIAGNOSIS — D124 Benign neoplasm of descending colon: Secondary | ICD-10-CM | POA: Diagnosis not present

## 2024-04-08 DIAGNOSIS — D125 Benign neoplasm of sigmoid colon: Secondary | ICD-10-CM

## 2024-04-08 DIAGNOSIS — D128 Benign neoplasm of rectum: Secondary | ICD-10-CM

## 2024-04-08 DIAGNOSIS — R195 Other fecal abnormalities: Secondary | ICD-10-CM

## 2024-04-08 DIAGNOSIS — Z1211 Encounter for screening for malignant neoplasm of colon: Secondary | ICD-10-CM | POA: Diagnosis present

## 2024-04-08 MED ORDER — SODIUM CHLORIDE 0.9 % IV SOLN: 500.0000 mL | Freq: Once | INTRAVENOUS | Status: DC

## 2024-04-08 NOTE — Progress Notes (Signed)
 Pt's states no medical or surgical changes since previsit or office visit.

## 2024-04-08 NOTE — Progress Notes (Signed)
 Called to room to assist during endoscopic procedure.  Patient ID and intended procedure confirmed with present staff. Received instructions for my participation in the procedure from the performing physician.

## 2024-04-08 NOTE — Patient Instructions (Signed)

## 2024-04-08 NOTE — Op Note (Signed)
 [facility] Patient Name: Anthony Harrington Procedure Date: 04/08/2024 9:22 AM MRN: [id number] Endoscopist: Inocente Hausen , MD, [id number] Age: 58 Referring MD:  Date of Birth: 08/29/1966 Gender: Male Account #: [id number] Procedure:                Colonoscopy Indications:              This is the patient's first colonoscopy, Positive                            Cologuard test Medicines:                Monitored Anesthesia Care Procedure:                Pre-Anesthesia Assessment:                           - Prior to the procedure, a History and Physical                            was performed, and patient medications and                            allergies were reviewed. The patient's tolerance of                            previous anesthesia was also reviewed. The risks                            and benefits of the procedure and the sedation                            options and risks were discussed with the patient.                            All questions were answered, and informed consent                            was obtained. Prior Anticoagulants: The patient has                            taken no anticoagulant or antiplatelet agents. ASA                            Grade Assessment: II - A patient with mild systemic                            disease. After reviewing the risks and benefits,                            the patient was deemed in satisfactory condition to                            undergo the procedure.  After obtaining informed consent, the colonoscope                            was passed under direct vision. Throughout the                            procedure, the patient's blood pressure, pulse, and                            oxygen saturations were monitored continuously. The                            Olympus CF-HQ190L ([id number]) Colonoscope was                            introduced through the anus and advanced to the                             cecum, identified by appendiceal orifice and                            ileocecal valve. The colonoscopy was performed                            without difficulty. The patient tolerated the                            procedure well. The quality of the bowel                            preparation was adequate. The ileocecal valve,                            appendiceal orifice, and rectum were photographed. Scope In: 9:36:13 AM Scope Out: 9:57:51 AM Scope Withdrawal Time: 0 hours 16 minutes 54 seconds  Total Procedure Duration: 0 hours 21 minutes 38 seconds  Findings:                 The perianal and digital rectal examinations were                            normal. Pertinent negatives include normal                            sphincter tone and no palpable rectal lesions.                           Two sessile polyps were found in the sigmoid colon                            and descending colon. The polyps were 5 to 6 mm in                            size. These polyps were removed with a cold  snare.                            Resection and retrieval were complete.                           Two sessile polyps were found in the rectum and                            sigmoid colon. The polyps were 4 mm in size. These                            polyps were removed with a cold biopsy forceps.                            Resection and retrieval were complete.                           The retroflexed view of the distal rectum and anal                            verge was normal and showed no anal or rectal                            abnormalities. Complications:            No immediate complications. Estimated blood loss:                            Minimal. Estimated Blood Loss:     Estimated blood loss was minimal. Impression:               - Two 5 to 6 mm polyps in the sigmoid colon and in                            the descending colon, removed with a cold snare.                             Resected and retrieved.                           - Two 4 mm polyps in the rectum and in the sigmoid                            colon, removed with a cold biopsy forceps. Resected                            and retrieved.                           - The distal rectum and anal verge are normal on                            retroflexion view. Recommendation:           -  Discharge patient to home (ambulatory).                           - Await pathology results.                           - Repeat colonoscopy for surveillance based on                            pathology results.                           - The findings and recommendations were discussed                            with the patient's family.                           - Patient has a contact number available for                            emergencies. The signs and symptoms of potential                            delayed complications were discussed with the                            patient. Return to normal activities tomorrow.                            Written discharge instructions were provided to the                            patient. Inocente Hausen, MD 04/08/2024 10:02:09 AM This report has been signed electronically.

## 2024-04-08 NOTE — Progress Notes (Signed)
 Sedate, gd SR, tolerated procedure well, VSS, report to RN

## 2024-04-09 ENCOUNTER — Telehealth: Payer: Self-pay

## 2024-04-09 NOTE — Telephone Encounter (Signed)
 Left message

## 2024-04-10 ENCOUNTER — Ambulatory Visit: Payer: Self-pay | Admitting: Pediatrics

## 2024-04-10 LAB — SURGICAL PATHOLOGY

## 2024-04-21 ENCOUNTER — Other Ambulatory Visit: Payer: Self-pay | Admitting: Internal Medicine

## 2024-05-16 ENCOUNTER — Other Ambulatory Visit: Payer: Self-pay | Admitting: Internal Medicine

## 2024-07-21 ENCOUNTER — Other Ambulatory Visit: Payer: Self-pay | Admitting: Internal Medicine

## 2024-08-10 ENCOUNTER — Other Ambulatory Visit: Payer: Self-pay | Admitting: Internal Medicine

## 2024-08-27 ENCOUNTER — Other Ambulatory Visit: Payer: Self-pay | Admitting: Internal Medicine

## 2024-09-02 ENCOUNTER — Other Ambulatory Visit: Payer: Self-pay | Admitting: Internal Medicine

## 2024-09-18 ENCOUNTER — Encounter: Payer: Self-pay | Admitting: Internal Medicine

## 2024-09-18 ENCOUNTER — Ambulatory Visit: Admitting: Internal Medicine

## 2024-09-18 VITALS — BP 130/70 | HR 52 | Temp 98.1°F | Ht 72.0 in | Wt 263.4 lb

## 2024-09-18 DIAGNOSIS — R7303 Prediabetes: Secondary | ICD-10-CM | POA: Diagnosis not present

## 2024-09-18 DIAGNOSIS — Z8673 Personal history of transient ischemic attack (TIA), and cerebral infarction without residual deficits: Secondary | ICD-10-CM

## 2024-09-18 DIAGNOSIS — G40909 Epilepsy, unspecified, not intractable, without status epilepticus: Secondary | ICD-10-CM | POA: Diagnosis not present

## 2024-09-18 DIAGNOSIS — E785 Hyperlipidemia, unspecified: Secondary | ICD-10-CM | POA: Diagnosis not present

## 2024-09-18 DIAGNOSIS — Z1159 Encounter for screening for other viral diseases: Secondary | ICD-10-CM | POA: Diagnosis not present

## 2024-09-18 DIAGNOSIS — Z0000 Encounter for general adult medical examination without abnormal findings: Secondary | ICD-10-CM | POA: Diagnosis not present

## 2024-09-18 DIAGNOSIS — F17200 Nicotine dependence, unspecified, uncomplicated: Secondary | ICD-10-CM

## 2024-09-18 LAB — COMPREHENSIVE METABOLIC PANEL WITH GFR
ALT: 14 U/L (ref 3–53)
AST: 15 U/L (ref 5–37)
Albumin: 4.4 g/dL (ref 3.5–5.2)
Alkaline Phosphatase: 90 U/L (ref 39–117)
BUN: 16 mg/dL (ref 6–23)
CO2: 32 meq/L (ref 19–32)
Calcium: 9.4 mg/dL (ref 8.4–10.5)
Chloride: 99 meq/L (ref 96–112)
Creatinine, Ser: 0.81 mg/dL (ref 0.40–1.50)
GFR: 98.05 mL/min (ref >60.00)
Glucose, Bld: 128 mg/dL — ABNORMAL HIGH (ref 70–99)
Potassium: 3.7 meq/L (ref 3.5–5.1)
Sodium: 138 meq/L (ref 135–145)
Total Bilirubin: 0.4 mg/dL (ref 0.2–1.2)
Total Protein: 7.2 g/dL (ref 6.0–8.3)

## 2024-09-18 LAB — HEMOGLOBIN A1C: Hgb A1c MFr Bld: 6 % (ref 4.6–6.5)

## 2024-09-18 NOTE — Assessment & Plan Note (Signed)
 No new symptoms. BP at goal and HGA1c under monitoring and lipids. Counseled to quit smoking and taking aspirin  daily.

## 2024-09-18 NOTE — Assessment & Plan Note (Signed)
 Checking HgA1c and adjust as needed.

## 2024-09-18 NOTE — Assessment & Plan Note (Signed)
Counseled to quit and unable to make attempt at this time.

## 2024-09-18 NOTE — Progress Notes (Signed)
° °  Subjective:   Patient ID: Anthony Harrington, male    DOB: [birth year], 58 y.o.   MRN: [id number]  The patient is here for physical. Pertinent topics discussed: Discussed the use of AI scribe software for clinical note transcription with the patient, who gave verbal consent to proceed.  History of Present Illness Anthony Harrington is a 58 year old male who presents for a follow-up visit.  No new chest pain, heart racing, pressure, or tightness. He continues to smoke one to two packs of cigarettes per day and finds it difficult to quit.  No new gastrointestinal symptoms such as diarrhea, constipation, heartburn, or blood in the stool. No new joint or muscle pains.  Energy levels have improved recently. No new headaches, migraines, vision changes, hearing changes, or stroke-like symptoms such as numbness, weakness, tingling, or speech changes.  No issues with current medications and requests refills. He feels his blood pressure is higher in the mornings.  He continues to abstain from alcohol. He drinks one soda in the morning and water for the rest of the day.  PMH, [facility], social history reviewed and updated  Review of Systems  Constitutional: Negative.   HENT: Negative.    Eyes: Negative.   Respiratory:  Negative for cough, chest tightness and shortness of breath.   Cardiovascular:  Negative for chest pain, palpitations and leg swelling.  Gastrointestinal:  Negative for abdominal distention, abdominal pain, constipation, diarrhea, nausea and vomiting.  Musculoskeletal: Negative.   Skin: Negative.   Neurological: Negative.   Psychiatric/Behavioral: Negative.      Objective:  Physical Exam Constitutional:      Appearance: He is well-developed.  HENT:     Head: Normocephalic and atraumatic.  Cardiovascular:     Rate and Rhythm: Normal rate and regular rhythm.  Pulmonary:     Effort: Pulmonary effort is normal. No respiratory distress.     Breath sounds: Normal breath sounds. No wheezing  or rales.  Abdominal:     General: Bowel sounds are normal. There is no distension.     Palpations: Abdomen is soft.     Tenderness: There is no abdominal tenderness.  Musculoskeletal:     Cervical back: Normal range of motion.  Skin:    General: Skin is warm and dry.  Neurological:     Mental Status: He is alert and oriented to person, place, and time.     Coordination: Coordination normal.     Vitals:   09/18/24 0826  BP: 130/70  Pulse: (!) 52  Temp: 98.1 F (36.7 C)  TempSrc: Oral  SpO2: 97%  Weight: 263 lb 6.4 oz (119.5 kg)  Height: 6' (1.829 m)    Assessment & Plan:

## 2024-09-18 NOTE — Assessment & Plan Note (Signed)
 Recent lipid panel stable due next visit.

## 2024-09-18 NOTE — Assessment & Plan Note (Signed)
 BMi 35 and complicated by hypertension. Counseled about diet and has lost 10 pounds since last visit.

## 2024-09-18 NOTE — Assessment & Plan Note (Signed)
 No recent episodes and suspect this was withdrawal at time. Continue to monitor off meds.

## 2024-09-18 NOTE — Assessment & Plan Note (Signed)
Flu shot declines. Pneumonia declines. Shingrix declines. Tetanus declines. Colonoscopy up to date. Counseled about sun safety and mole surveillance. Counseled about the dangers of distracted driving. Given 10 year screening recommendations.

## 2024-09-20 ENCOUNTER — Ambulatory Visit: Payer: Self-pay | Admitting: Internal Medicine

## 2024-09-21 LAB — HEPATITIS C ANTIBODY: Hepatitis C Ab: NONREACTIVE

## 2024-09-27 ENCOUNTER — Other Ambulatory Visit: Payer: Self-pay | Admitting: Internal Medicine

## 2024-09-29 ENCOUNTER — Other Ambulatory Visit: Payer: Self-pay | Admitting: Internal Medicine

## 2024-10-03 ENCOUNTER — Other Ambulatory Visit: Payer: Self-pay | Admitting: Internal Medicine

## 2024-10-27 ENCOUNTER — Other Ambulatory Visit: Payer: Self-pay | Admitting: Internal Medicine

## 2024-10-30 ENCOUNTER — Other Ambulatory Visit: Payer: Self-pay | Admitting: Internal Medicine

## 2024-10-31 ENCOUNTER — Other Ambulatory Visit: Payer: Self-pay

## 2024-10-31 ENCOUNTER — Telehealth: Payer: Self-pay

## 2024-10-31 MED ORDER — ATENOLOL 100 MG PO TABS: 100.0000 mg | ORAL_TABLET | Freq: Every day | ORAL | 3 refills | Status: AC

## 2024-10-31 MED ORDER — LOSARTAN POTASSIUM-HCTZ 100-25 MG PO TABS: 1.0000 | ORAL_TABLET | Freq: Every day | ORAL | 3 refills | Status: AC

## 2024-10-31 NOTE — Telephone Encounter (Signed)
 This issue has since been resolved.

## 2024-10-31 NOTE — Telephone Encounter (Signed)
 Copied from CRM #[id number]. Topic: Clinical - Medication Question >> Oct 31, 2024 10:54 AM Corin V wrote: Reason for CRM: Patient called in about the atenolol  (TENORMIN ) 100 MG tablet and losartan -hydrochlorothiazide  (HYZAAR) 100-25 MG tablet. HE stated Dr. Rollene had agreed to send in scripts for 1 year and there is only a 30 day script sent in. Please advise if this can be changed to a 90 day script with refills for a yea starting in February

## 2024-11-02 ENCOUNTER — Other Ambulatory Visit: Payer: Self-pay | Admitting: Internal Medicine
